# Patient Record
Sex: Male | Born: 1950 | Race: Black or African American | Hispanic: No | Marital: Married | Smoking: Never smoker
Health system: Southern US, Community
[De-identification: ages and names within clinical notes are randomized; demographics above are authoritative.]

## PROBLEM LIST (undated history)

## (undated) DIAGNOSIS — E119 Type 2 diabetes mellitus without complications: Secondary | ICD-10-CM

## (undated) DIAGNOSIS — N289 Disorder of kidney and ureter, unspecified: Secondary | ICD-10-CM

## (undated) DIAGNOSIS — I10 Essential (primary) hypertension: Secondary | ICD-10-CM

## (undated) DIAGNOSIS — I214 Non-ST elevation (NSTEMI) myocardial infarction: Secondary | ICD-10-CM

## (undated) HISTORY — DX: Non-ST elevation (NSTEMI) myocardial infarction: I21.4

## (undated) HISTORY — PX: CORONARY ANGIOPLASTY: SHX604

## (undated) HISTORY — PX: CARDIAC CATHETERIZATION: SHX172

## (undated) HISTORY — PX: CYSTOSCOPY: SUR368

---

## 1999-07-12 ENCOUNTER — Emergency Department (HOSPITAL_COMMUNITY): Admission: EM | Admit: 1999-07-12 | Discharge: 1999-07-12 | Payer: Self-pay | Admitting: Emergency Medicine

## 1999-07-13 ENCOUNTER — Encounter: Payer: Self-pay | Admitting: Emergency Medicine

## 2000-06-16 ENCOUNTER — Emergency Department (HOSPITAL_COMMUNITY): Admission: EM | Admit: 2000-06-16 | Discharge: 2000-06-16 | Payer: Self-pay | Admitting: Emergency Medicine

## 2000-06-16 ENCOUNTER — Encounter: Payer: Self-pay | Admitting: Emergency Medicine

## 2006-10-10 ENCOUNTER — Encounter: Admission: RE | Admit: 2006-10-10 | Discharge: 2006-10-10 | Payer: Self-pay | Admitting: Urology

## 2007-12-14 ENCOUNTER — Ambulatory Visit (HOSPITAL_COMMUNITY): Admission: RE | Admit: 2007-12-14 | Discharge: 2007-12-14 | Payer: Self-pay | Admitting: *Deleted

## 2007-12-14 ENCOUNTER — Encounter (INDEPENDENT_AMBULATORY_CARE_PROVIDER_SITE_OTHER): Payer: Self-pay | Admitting: *Deleted

## 2010-10-30 NOTE — Op Note (Signed)
Colin Weaver, Colin Weaver                 ACCOUNT NO.:  [id number]   MEDICAL RECORD NO.:  [id number]          PATIENT TYPE:  AMB   LOCATION:  ENDO                         FACILITY:  [facility]   PHYSICIAN:  Georgiana Spinner, M.D.    DATE OF BIRTH:  12/04/1950   DATE OF PROCEDURE:  DATE OF DISCHARGE:                               OPERATIVE REPORT   PROCEDURE:  Colonoscopy.   INDICATIONS:  Colon cancer screening.   ANESTHESIA:  Fentanyl 20 mcg, Versed 1 mg.   DESCRIPTION OF PROCEDURE:  With the patient mildly sedated in the left  lateral decubitus position, a rectal exam was performed which was  unremarkable.  Subsequently, the Pentax videoscopic colonoscope was  inserted into the rectum and passed under direct vision to the cecum,  identified by ileocecal valve and appendiceal orifice, both which were  photographed.  From this point, the colonoscope was slowly withdrawn  taking circumferential views of the colonic mucosa, stopping at  approximately 50 cm from the anal verge at which point there appeared to  be a polypoid area which may have been abnormal mucosa.  It appeared  normal, but polypoid, so I elected to just biopsy it.  We then withdrew  the colonoscope taking circumferential views of the remaining colonic  mucosa.  As we withdrew to the rectum, stopping at 25 cm from anal verge  at which point there was a polyp seen, photographed and removed using  snare cautery technique on a setting of 20/150 blended current around  the base of the stalk.  There was good hemostasis.  Subsequently, the  rectum appeared normal on direct and showed hemorrhoids on retroflexed  view.  The endoscope was straightened and withdrawn.  The patient's  vital signs and pulse oximeter remained stable.  The patient tolerated  the procedure well without apparent complication.   FINDINGS:  Polyp at 25 cm.  Questionable polyp of 50 cm, which may be  normal.  Internal hemorrhoids and some mild diverticulosis.   PLAN:  Await biopsy reports.  The patient will call me for results and  follow-up with me as needed as an outpatient.           ______________________________  Georgiana Spinner, M.D.     GMO/MEDQ  D:  12/14/2007  T:  12/14/2007  Job:  [id number]

## 2010-10-30 NOTE — Op Note (Signed)
Colin Weaver, Colin Weaver                 ACCOUNT NO.:  [id number]   MEDICAL RECORD NO.:  [id number]          PATIENT TYPE:  AMB   LOCATION:  ENDO                         FACILITY:  [facility]   PHYSICIAN:  Georgiana Spinner, M.D.    DATE OF BIRTH:  1950-10-26   DATE OF PROCEDURE:  12/14/2007  DATE OF DISCHARGE:                               OPERATIVE REPORT   PROCEDURE:  Upper endoscopy.   INDICATIONS:  GERD.   ANESTHESIA:  1. Fentanyl 60 mcg.  2. Versed 6 mg.   PROCEDURE:  With the patient mildly sedated in the left lateral  decubitus position, the Pentax videoscopic endoscope was inserted in the  mouth and passed under direct vision through the esophagus, which  appeared normal, except there were questionable islands of Barrett's  esophagus.  It did not fully distend, so I could tell if this was just a  normal variant, so I elected to biopsy the areas.  We entered into the  stomach.  Fundus and body appeared normal.  Antrum, however, showed some  erythematous changes which were photographed and biopsied.  Duodenal  bulb and second portion of the duodenum appeared normal.  From this  point, the endoscope was slowly withdrawn, taking circumferential views  of the duodenal mucosa until the endoscope had been pulled back into the  stomach and placed in retroflexion to the stomach from below.  The  endoscope was straightened and withdrawn, taking circumferential views  of the remaining gastric and esophageal mucosa.  The patient's vital  signs and pulse oximeter remained stable.  The patient tolerated the  procedure well, without apparent complications.   FINDINGS:  1. Question of Barrett's esophagus, biopsied.  Await biopsy report.  2. Erythema of antrum, biopsied.  Await biopsy report.   The patient will call me for results and follow up with me as an  outpatient.  Proceed to colonoscopy as planned           ______________________________  Georgiana Spinner, M.D.     GMO/MEDQ  D:   12/14/2007  T:  12/14/2007  Job:  [id number]

## 2013-08-22 ENCOUNTER — Emergency Department (HOSPITAL_COMMUNITY)
Admission: EM | Admit: 2013-08-22 | Discharge: 2013-08-22 | Disposition: A | Discharge status: Home / Self Care | Payer: 59 | Attending: Emergency Medicine | Admitting: Emergency Medicine

## 2013-08-22 ENCOUNTER — Emergency Department (HOSPITAL_COMMUNITY): Payer: 59

## 2013-08-22 ENCOUNTER — Encounter (HOSPITAL_COMMUNITY): Payer: Self-pay | Admitting: Emergency Medicine

## 2013-08-22 DIAGNOSIS — N200 Calculus of kidney: Secondary | ICD-10-CM | POA: Insufficient documentation

## 2013-08-22 DIAGNOSIS — I10 Essential (primary) hypertension: Secondary | ICD-10-CM | POA: Insufficient documentation

## 2013-08-22 DIAGNOSIS — E119 Type 2 diabetes mellitus without complications: Secondary | ICD-10-CM | POA: Insufficient documentation

## 2013-08-22 DIAGNOSIS — R52 Pain, unspecified: Secondary | ICD-10-CM | POA: Insufficient documentation

## 2013-08-22 HISTORY — DX: Disorder of kidney and ureter, unspecified: N28.9

## 2013-08-22 HISTORY — DX: Type 2 diabetes mellitus without complications: E11.9

## 2013-08-22 HISTORY — DX: Essential (primary) hypertension: I10

## 2013-08-22 LAB — COMPREHENSIVE METABOLIC PANEL
ALT: 15 U/L (ref 0–53)
AST: 15 U/L (ref 0–37)
Albumin: 3.5 g/dL (ref 3.5–5.2)
Alkaline Phosphatase: 52 U/L (ref 39–117)
BUN: 13 mg/dL (ref 6–23)
CO2: 25 mEq/L (ref 19–32)
Calcium: 9.2 mg/dL (ref 8.4–10.5)
Chloride: 103 mEq/L (ref 96–112)
Creatinine, Ser: 1.28 mg/dL (ref 0.50–1.35)
GFR calc Af Amer: 68 mL/min — ABNORMAL LOW (ref >90)
GFR calc non Af Amer: 59 mL/min — ABNORMAL LOW (ref >90)
Glucose, Bld: 145 mg/dL — ABNORMAL HIGH (ref 70–99)
Potassium: 4.2 mEq/L (ref 3.7–5.3)
Sodium: 142 mEq/L (ref 137–147)
Total Bilirubin: 0.2 mg/dL — ABNORMAL LOW (ref 0.3–1.2)
Total Protein: 7.3 g/dL (ref 6.0–8.3)

## 2013-08-22 LAB — CBC WITH DIFFERENTIAL/PLATELET
Basophils Absolute: 0 10*3/uL (ref 0.0–0.1)
Basophils Relative: 0 % (ref 0–1)
Eosinophils Absolute: 0.1 10*3/uL (ref 0.0–0.7)
Eosinophils Relative: 1 % (ref 0–5)
HCT: 42.2 % (ref 39.0–52.0)
Hemoglobin: 14.1 g/dL (ref 13.0–17.0)
Lymphocytes Relative: 29 % (ref 12–46)
Lymphs Abs: 2.9 10*3/uL (ref 0.7–4.0)
MCH: 29.3 pg (ref 26.0–34.0)
MCHC: 33.4 g/dL (ref 30.0–36.0)
MCV: 87.7 fL (ref 78.0–100.0)
Monocytes Absolute: 1.2 10*3/uL — ABNORMAL HIGH (ref 0.1–1.0)
Monocytes Relative: 12 % (ref 3–12)
Neutro Abs: 6 10*3/uL (ref 1.7–7.7)
Neutrophils Relative %: 59 % (ref 43–77)
Platelets: 264 10*3/uL (ref 150–400)
RBC: 4.81 MIL/uL (ref 4.22–5.81)
RDW: 13.7 % (ref 11.5–15.5)
WBC: 10.2 10*3/uL (ref 4.0–10.5)

## 2013-08-22 LAB — URINALYSIS, ROUTINE W REFLEX MICROSCOPIC
Bilirubin Urine: NEGATIVE
Glucose, UA: NEGATIVE mg/dL
Ketones, ur: NEGATIVE mg/dL
Leukocytes, UA: NEGATIVE
Nitrite: NEGATIVE
Protein, ur: NEGATIVE mg/dL
Specific Gravity, Urine: 1.03 (ref 1.005–1.030)
Urobilinogen, UA: 0.2 mg/dL (ref 0.0–1.0)
pH: 6 (ref 5.0–8.0)

## 2013-08-22 LAB — URINE MICROSCOPIC-ADD ON

## 2013-08-22 MED ORDER — ONDANSETRON 4 MG PO TBDP
8.0000 mg | ORAL_TABLET | Freq: Once | ORAL | Status: AC
  Administered 2013-08-22: 8 mg via ORAL
  Filled 2013-08-22: qty 2

## 2013-08-22 MED ORDER — TAMSULOSIN HCL 0.4 MG PO CAPS: 0.4000 mg | ORAL_CAPSULE | Freq: Every day | ORAL | Status: DC

## 2013-08-22 MED ORDER — OXYCODONE-ACETAMINOPHEN 5-325 MG PO TABS
2.0000 | ORAL_TABLET | Freq: Once | ORAL | Status: AC
  Administered 2013-08-22: 2 via ORAL
  Filled 2013-08-22: qty 2

## 2013-08-22 MED ORDER — KETOROLAC TROMETHAMINE 30 MG/ML IJ SOLN
60.0000 mg | Freq: Once | INTRAMUSCULAR | Status: AC
  Administered 2013-08-22: 60 mg via INTRAMUSCULAR
  Filled 2013-08-22: qty 2

## 2013-08-22 MED ORDER — OXYCODONE-ACETAMINOPHEN 5-325 MG PO TABS: 2.0000 | ORAL_TABLET | ORAL | Status: DC | PRN

## 2013-08-22 MED ORDER — ONDANSETRON 8 MG PO TBDP: 8.0000 mg | ORAL_TABLET | Freq: Three times a day (TID) | ORAL | Status: DC | PRN

## 2013-08-22 NOTE — ED Notes (Signed)
Patient has hx of kidney stones. Patient reports that this pain is just like his pain with his previous kidney stone. Patient does have nausea at this time but no emesis. No dysuria, but does report hematuria.

## 2013-08-22 NOTE — ED Notes (Signed)
The pt is c/0 lt flank pain since 1700 today.  He has had bloody urine for the past 3 days.  Hx of kidney stones

## 2013-08-22 NOTE — ED Notes (Signed)
Patient is being discharged with wife to home.

## 2013-08-22 NOTE — ED Provider Notes (Signed)
CSN: [id number]     Arrival date & time 08/22/13  0134 History   First MD Initiated Contact with Patient 08/22/13 [phone]     Chief Complaint  Patient presents with  . Flank Pain     (Consider location/radiation/quality/duration/timing/severity/associated sxs/prior Treatment) HPI 63 year old male presents to emergency room with complaint of left flank.  Pain started around 5 PM today.  Pain starts in his left back and radiates around into his groin.  Patient has history of kidney stones on the right, last 5 years ago.  No prior interventions, for stones, has been able to pass them  on his own in the past week.  He has had nausea without vomiting.  No fever no chills. Past Medical History  Diagnosis Date  . Hypertension   . Diabetes mellitus without complication   . Renal disorder    History reviewed. No pertinent past surgical history. No family history on file. History  Substance Use Topics  . Smoking status: Never Smoker   . Smokeless tobacco: Not on file  . Alcohol Use: No    Review of Systems   See History of Present Illness; otherwise all other systems are reviewed and negative  Allergies  Prinivil  Home Medications   Current Outpatient Rx  Name  Route  Sig  Dispense  Refill  . ondansetron (ZOFRAN ODT) 8 MG disintegrating tablet   Oral   Take 1 tablet (8 mg total) by mouth every 8 (eight) hours as needed for nausea or vomiting.   20 tablet   0   . oxyCODONE-acetaminophen (PERCOCET/ROXICET) 5-325 MG per tablet   Oral   Take 2 tablets by mouth every 4 (four) hours as needed for severe pain.   20 tablet   0   . tamsulosin (FLOMAX) 0.4 MG CAPS capsule   Oral   Take 1 capsule (0.4 mg total) by mouth daily.   10 capsule   0    BP 131/76  Pulse 55  Temp(Src) 97.8 F (36.6 C) (Oral)  Resp 15  Ht 5\' 10"  (1.778 m)  Wt 267 lb 6 oz (121.281 kg)  BMI 38.36 kg/m2  SpO2 97% Physical Exam  Nursing note and vitals reviewed. Constitutional: He is oriented to person,  place, and time. He appears well-developed and well-nourished. He appears distressed.  HENT:  Head: Normocephalic and atraumatic.  Right Ear: External ear normal.  Left Ear: External ear normal.  Nose: Nose normal.  Mouth/Throat: Oropharynx is clear and moist.  Eyes: Conjunctivae and EOM are normal. Pupils are equal, round, and reactive to light.  Neck: Normal range of motion. Neck supple. No JVD present. No tracheal deviation present. No thyromegaly present.  Cardiovascular: Normal rate, regular rhythm, normal heart sounds and intact distal pulses.  Exam reveals no gallop and no friction rub.   No murmur heard. Pulmonary/Chest: Effort normal and breath sounds normal. No stridor. No respiratory distress. He has no wheezes. He has no rales. He exhibits no tenderness.  Abdominal: Soft. Bowel sounds are normal. He exhibits no distension and no mass. There is no tenderness. There is no rebound and no guarding.  Musculoskeletal: Normal range of motion. He exhibits no edema and no tenderness.  Lymphadenopathy:    He has no cervical adenopathy.  Neurological: He is alert and oriented to person, place, and time. He exhibits normal muscle tone. Coordination normal.  Skin: Skin is warm and dry. No rash noted. No erythema. No pallor.  Psychiatric: He has a normal mood and affect.  His behavior is normal. Judgment and thought content normal.    ED Course  Procedures (including critical care time) Labs Review Labs Reviewed  URINALYSIS, ROUTINE W REFLEX MICROSCOPIC - Abnormal; Notable for the following:    APPearance CLOUDY (*)    Hgb urine dipstick LARGE (*)    All other components within normal limits  CBC WITH DIFFERENTIAL - Abnormal; Notable for the following:    Monocytes Absolute 1.2 (*)    All other components within normal limits  COMPREHENSIVE METABOLIC PANEL - Abnormal; Notable for the following:    Glucose, Bld 145 (*)    Total Bilirubin 0.2 (*)    GFR calc non Af Amer 59 (*)    GFR  calc Af Amer 68 (*)    All other components within normal limits  URINE MICROSCOPIC-ADD ON - Abnormal; Notable for the following:    Casts HYALINE CASTS (*)    All other components within normal limits   Imaging Review Ct Abdomen Pelvis Wo Contrast  08/22/2013   CLINICAL DATA:  Left flank pain  EXAM: CT ABDOMEN AND PELVIS WITHOUT CONTRAST  TECHNIQUE: Multidetector CT imaging of the abdomen and pelvis was performed following the standard protocol without intravenous contrast.  COMPARISON:  10/10/2006  FINDINGS: BODY WALL: Fatty right inguinal hernia.  LOWER CHEST: Borderline cardiomegaly.  ABDOMEN/PELVIS:  Liver: No focal abnormality.  Biliary: Layering high density, suspect cholelithiasis.  Pancreas: Unremarkable.  Spleen: Unremarkable.  Adrenals: Unremarkable.  Kidneys and ureters: Mild left hydroureteronephrosis with asymmetric left perinephric edema, secondary to a 5 mm stone in the distal third of the left ureter. There is a punctate stone in the upper pole right kidney which is nonobstructive. There are bilateral water density renal masses, presumably cysts. The largest on the left is posterior interpolar, 4 cm in diameter. The largest on the right is 15 mm in the interpolar region.  Bladder: Unremarkable.  Reproductive: Unremarkable.  Bowel: Extensive colonic diverticulosis distally. No bowel obstruction. Normal appendix.  Retroperitoneum: No mass or adenopathy.  Peritoneum: No free fluid or gas.  Vascular: No acute abnormality.  OSSEOUS: No acute abnormalities.  IMPRESSION: 1. Obstructing 5 mm stone in the distal left ureter. 2. Small, nonobstructive right nephrolithiasis. 3. Colonic diverticulosis. 4. Bilateral renal cysts. 5. Probable cholelithiasis.   Electronically Signed   By: Tiburcio Pea M.D.   On: 08/22/2013 06:34     EKG Interpretation None      MDM   Final diagnoses:  Left nephrolithiasis    63 year old male with left flank.  Pain that seems typical for renal colic.  Plan for  CT without contrast of abdomen and pelvis to rule out other life-threatening causes.  Will provide patient with pain medication.    Olivia Mackie, MD 08/22/13 [phone]

## 2013-08-22 NOTE — Discharge Instructions (Signed)
You have a 5 mm kidney stone on the left side midway between your kidney and your bladder.  Take medications as prescribed.  You should be able to pass the stone on your own.  Followup with your urologist this week for recheck.  Return to the emergency room for worsening condition or new concerning symptoms.

## 2013-08-26 ENCOUNTER — Ambulatory Visit: Payer: Self-pay | Admitting: Anesthesiology

## 2013-08-26 LAB — BASIC METABOLIC PANEL
Anion Gap: 4 — ABNORMAL LOW (ref 7–16)
BUN: 8 mg/dL (ref 7–18)
Calcium, Total: 8.9 mg/dL (ref 8.5–10.1)
Chloride: 101 mmol/L (ref 98–107)
Co2: 28 mmol/L (ref 21–32)
Creatinine: 1.92 mg/dL — ABNORMAL HIGH (ref 0.60–1.30)
EGFR (African American): 43 — ABNORMAL LOW
EGFR (Non-African Amer.): 37 — ABNORMAL LOW
Glucose: 141 mg/dL — ABNORMAL HIGH (ref 65–99)
Osmolality: 267 (ref 275–301)
Potassium: 4.8 mmol/L (ref 3.5–5.1)
Sodium: 133 mmol/L — ABNORMAL LOW (ref 136–145)

## 2013-08-30 ENCOUNTER — Ambulatory Visit: Payer: Self-pay | Admitting: Urology

## 2014-10-08 NOTE — Op Note (Signed)
PATIENT NAME:  Sheryle HailGOLSON, Kayd MR#:  [id number] DATE OF BIRTH:  Oct 17, 1950  DATE OF PROCEDURE:  08/30/2013  PREOPERATIVE DIAGNOSIS: Left ureterolithiasis.   POSTOPERATIVE DIAGNOSIS: Passed his left ureteral calculus.   PROCEDURES: 1.  Left ureteroscopy.  2.  Fluoroscopy.   SURGEON: Anola GurneyMichael Bellamy Rubey, M.D.   ANESTHETIST: Dr. Dimple Caseyice.   ANESTHETIC METHOD: General per Dr. Dimple Caseyice and local per Dr. Evelene CroonWolff.   INDICATIONS: See his dictated history and physical. After informed consent, the patient requests the above procedure.   OPERATIVE SUMMARY: After adequate general anesthesia had been obtained, the patient was placed into dorsal lithotomy position and the perineum was prepped and draped in the usual fashion. The 21-French cystoscope was coupled with the camera and then visually advanced into the bladder. The patient had lateral lobe prostatic hypertrophy. No bladder mucosal lesions were identified. Both ureteral orifices were identified and had clear efflux. At this point, fluoroscopy was performed. Definite ureteral stone could not be identified. A 0.035 Glidewire was then advanced up the left ureter. The intramural ureter was dilated up to 5 mm with a balloon dilating catheter. The balloon dilating catheter was then removed, taking care to leave the guidewire in position. The mini rigid ureteroscope was coupled with the camera and then advanced into the left orifice. The scope was advanced to mid ureter and stone could not be identified. Due to angulation, the scope could not be advanced beyond this point. The rigid ureteroscope was then removed. Ureteral access sheath was then placed. The flexible ureteroscope was then coupled with the camera and advanced into the access sheath. Ureteroscope was advanced up the ureter beyond the prior mid ureter and into the kidney. All calyces were inspected and the stone was not located. The scope was then backed out of the ureter along with access sheath and ureter  reinspected. A stone was not located. At this point, the scope and sheath were removed; 10 mL of viscous Xylocaine was instilled within the urethra and the bladder. The procedure was then terminated and the patient was transferred to the recovery room in stable condition.      ____________________________ Suszanne ConnersMichael R. Evelene CroonWolff, MD mrw:dmm D: 08/30/2013 13:31:00 ET T: 08/30/2013 21:58:56 ET JOB#: [id number]  cc: Suszanne ConnersMichael R. Evelene CroonWolff, MD, <Dictator> Orson ApeMICHAEL R Tora Prunty MD ELECTRONICALLY SIGNED 08/31/2013 9:31

## 2014-10-08 NOTE — H&P (Signed)
PATIENT NAME:  Colin Weaver, Colin Weaver MR#:  [id number] DATE OF BIRTH:  [birth year]  DATE OF ADMISSION:  08/30/2013  CHIEF COMPLAINT: Left flank pain and hematuria.   HISTORY OF PRESENT ILLNESS: Mr. Colin Weaver is a 64 year old African American male who presented to the Emergency Room on March the 8th with severe left flank pain. CT scan was performed indicating the presence of a 5 mm obstructing distal left ureteral stone. He has failed to pass the stone and comes in now for left ureteroscopic ureterolithotomy with holmium laser lithotripsy.   ALLERGIES: No known drug allergies.   CURRENT MEDICATIONS: Include aspirin, Cialis, Cozaar, metformin, Norvasc, Zofran, Percocet, pravastatin, Rapaflo, and Zoloft.   PAST SURGICAL HISTORY: Testicular cyst removal.   PAST AND CURRENT MEDICAL CONDITIONS:  1.  Diabetes.  2.  Hypertension.  3.  Hypercholesterolemia.  4.  Erectile dysfunction.   SOCIAL HISTORY: The patient denied tobacco or alcohol use.   FAMILY HISTORY: Remarkable for parents with diabetes.   REVIEW OF SYSTEMS: The patient denies chest pain, shortness of breath or stroke. He also denied history of coronary artery disease.   PHYSICAL EXAMINATION: GENERAL: Well-nourished African American male in no acute distress.  HEENT: Sclerae were clear.  NECK: Supple. No palpable cervical adenopathy.  LUNGS: Clear to auscultation.  HEART: Regular rhythm and rate without audible murmurs.  ABDOMEN: Soft, nontender abdomen.  GENITOURINARY: Testes were smooth and nontender, 18 mL in size each.  RECTAL: A 25 gram smooth nontender prostate.  NEUROMUSCULAR: Alert and oriented x3.   IMPRESSION: Left ureterolithiasis with hydronephrosis.   PLAN: Left ureteroscopic ureterolithotomy with holmium laser lithotripsy. ____________________________ Suszanne ConnersMichael R. Evelene CroonWolff, MD mrw:sb D: 08/26/2013 16:53:38 ET T: 08/26/2013 17:16:26 ET JOB#: [id number]  cc: Suszanne ConnersMichael R. Evelene CroonWolff, MD, <Dictator> Orson ApeMICHAEL R Cayden Granholm MD ELECTRONICALLY  SIGNED 08/30/2013 9:53

## 2015-06-02 ENCOUNTER — Other Ambulatory Visit: Payer: Self-pay | Admitting: Gastroenterology

## 2015-09-09 ENCOUNTER — Observation Stay: Payer: 59

## 2015-09-09 ENCOUNTER — Encounter: Payer: Self-pay | Admitting: Emergency Medicine

## 2015-09-09 ENCOUNTER — Observation Stay
Admission: EM | Admit: 2015-09-09 | Discharge: 2015-09-10 | Disposition: A | Discharge status: Home / Self Care | Payer: 59 | Attending: Internal Medicine | Admitting: Internal Medicine

## 2015-09-09 DIAGNOSIS — Z7982 Long term (current) use of aspirin: Secondary | ICD-10-CM | POA: Insufficient documentation

## 2015-09-09 DIAGNOSIS — I519 Heart disease, unspecified: Secondary | ICD-10-CM | POA: Insufficient documentation

## 2015-09-09 DIAGNOSIS — R55 Syncope and collapse: Principal | ICD-10-CM | POA: Diagnosis present

## 2015-09-09 DIAGNOSIS — R001 Bradycardia, unspecified: Secondary | ICD-10-CM | POA: Diagnosis present

## 2015-09-09 DIAGNOSIS — E785 Hyperlipidemia, unspecified: Secondary | ICD-10-CM | POA: Insufficient documentation

## 2015-09-09 DIAGNOSIS — I10 Essential (primary) hypertension: Secondary | ICD-10-CM | POA: Insufficient documentation

## 2015-09-09 DIAGNOSIS — Z79899 Other long term (current) drug therapy: Secondary | ICD-10-CM | POA: Insufficient documentation

## 2015-09-09 DIAGNOSIS — Z7984 Long term (current) use of oral hypoglycemic drugs: Secondary | ICD-10-CM | POA: Diagnosis not present

## 2015-09-09 DIAGNOSIS — R05 Cough: Secondary | ICD-10-CM | POA: Insufficient documentation

## 2015-09-09 DIAGNOSIS — I517 Cardiomegaly: Secondary | ICD-10-CM | POA: Insufficient documentation

## 2015-09-09 DIAGNOSIS — R29898 Other symptoms and signs involving the musculoskeletal system: Secondary | ICD-10-CM | POA: Diagnosis not present

## 2015-09-09 DIAGNOSIS — E119 Type 2 diabetes mellitus without complications: Secondary | ICD-10-CM | POA: Diagnosis not present

## 2015-09-09 DIAGNOSIS — F329 Major depressive disorder, single episode, unspecified: Secondary | ICD-10-CM | POA: Insufficient documentation

## 2015-09-09 DIAGNOSIS — Z888 Allergy status to other drugs, medicaments and biological substances status: Secondary | ICD-10-CM | POA: Diagnosis not present

## 2015-09-09 DIAGNOSIS — I959 Hypotension, unspecified: Secondary | ICD-10-CM | POA: Insufficient documentation

## 2015-09-09 LAB — URINALYSIS COMPLETE WITH MICROSCOPIC (ARMC ONLY)
Bacteria, UA: NONE SEEN
Bilirubin Urine: NEGATIVE
Glucose, UA: 150 mg/dL — AB
Hgb urine dipstick: NEGATIVE
Ketones, ur: NEGATIVE mg/dL
Leukocytes, UA: NEGATIVE
Nitrite: NEGATIVE
Protein, ur: 30 mg/dL — AB
Specific Gravity, Urine: 1.027 (ref 1.005–1.030)
pH: 5 (ref 5.0–8.0)

## 2015-09-09 LAB — CBC
HCT: 41.9 % (ref 40.0–52.0)
Hemoglobin: 13.8 g/dL (ref 13.0–18.0)
MCH: 28.6 pg (ref 26.0–34.0)
MCHC: 32.9 g/dL (ref 32.0–36.0)
MCV: 86.9 fL (ref 80.0–100.0)
Platelets: 220 10*3/uL (ref 150–440)
RBC: 4.82 MIL/uL (ref 4.40–5.90)
RDW: 14.1 % (ref 11.5–14.5)
WBC: 7.8 10*3/uL (ref 3.8–10.6)

## 2015-09-09 LAB — BASIC METABOLIC PANEL
Anion gap: 6 (ref 5–15)
BUN: 15 mg/dL (ref 6–20)
CO2: 25 mmol/L (ref 22–32)
Calcium: 8.3 mg/dL — ABNORMAL LOW (ref 8.9–10.3)
Chloride: 103 mmol/L (ref 101–111)
Creatinine, Ser: 1.4 mg/dL — ABNORMAL HIGH (ref 0.61–1.24)
GFR calc Af Amer: 60 mL/min — ABNORMAL LOW (ref >60)
GFR calc non Af Amer: 52 mL/min — ABNORMAL LOW (ref >60)
Glucose, Bld: 281 mg/dL — ABNORMAL HIGH (ref 65–99)
Potassium: 4.2 mmol/L (ref 3.5–5.1)
Sodium: 134 mmol/L — ABNORMAL LOW (ref 135–145)

## 2015-09-09 LAB — GLUCOSE, CAPILLARY
Glucose-Capillary: 208 mg/dL — ABNORMAL HIGH (ref 65–99)
Glucose-Capillary: 216 mg/dL — ABNORMAL HIGH (ref 65–99)

## 2015-09-09 LAB — TROPONIN I: Troponin I: <0.03 ng/mL (ref <0.031)

## 2015-09-09 LAB — TSH: TSH: 2.085 u[IU]/mL (ref 0.350–4.500)

## 2015-09-09 MED ORDER — ONDANSETRON HCL 4 MG PO TABS: 4.0000 mg | ORAL_TABLET | Freq: Four times a day (QID) | ORAL | Status: DC | PRN

## 2015-09-09 MED ORDER — METFORMIN HCL 500 MG PO TABS
1000.0000 mg | ORAL_TABLET | Freq: Every day | ORAL | Status: DC
  Administered 2015-09-09: 1000 mg via ORAL
  Filled 2015-09-09: qty 2

## 2015-09-09 MED ORDER — SODIUM CHLORIDE 0.9% FLUSH: 3.0000 mL | INTRAVENOUS | Status: DC | PRN

## 2015-09-09 MED ORDER — SERTRALINE HCL 100 MG PO TABS
100.0000 mg | ORAL_TABLET | Freq: Every day | ORAL | Status: DC
  Administered 2015-09-09 – 2015-09-10 (×2): 100 mg via ORAL
  Filled 2015-09-09 (×2): qty 1

## 2015-09-09 MED ORDER — ACETAMINOPHEN 650 MG RE SUPP: 650.0000 mg | Freq: Four times a day (QID) | RECTAL | Status: DC | PRN

## 2015-09-09 MED ORDER — SODIUM CHLORIDE 0.9% FLUSH
3.0000 mL | Freq: Two times a day (BID) | INTRAVENOUS | Status: DC
  Administered 2015-09-09: 3 mL via INTRAVENOUS

## 2015-09-09 MED ORDER — SODIUM CHLORIDE 0.9 % IV SOLN: 250.0000 mL | INTRAVENOUS | Status: DC | PRN

## 2015-09-09 MED ORDER — TESTOSTERONE 20.25 MG/ACT (1.62%) TD GEL: 1.0000 "application " | Freq: Every day | TRANSDERMAL | Status: DC

## 2015-09-09 MED ORDER — ENOXAPARIN SODIUM 40 MG/0.4ML ~~LOC~~ SOLN
40.0000 mg | Freq: Two times a day (BID) | SUBCUTANEOUS | Status: DC
  Administered 2015-09-09 – 2015-09-10 (×2): 40 mg via SUBCUTANEOUS
  Filled 2015-09-09 (×2): qty 0.4

## 2015-09-09 MED ORDER — LOSARTAN POTASSIUM 50 MG PO TABS
50.0000 mg | ORAL_TABLET | Freq: Every day | ORAL | Status: DC
  Filled 2015-09-09: qty 1

## 2015-09-09 MED ORDER — ONDANSETRON HCL 4 MG/2ML IJ SOLN
4.0000 mg | Freq: Four times a day (QID) | INTRAMUSCULAR | Status: DC | PRN
Start: 2015-09-09 — End: 2015-09-10

## 2015-09-09 MED ORDER — ACETAMINOPHEN 325 MG PO TABS: 650.0000 mg | ORAL_TABLET | Freq: Four times a day (QID) | ORAL | Status: DC | PRN

## 2015-09-09 MED ORDER — ASPIRIN EC 81 MG PO TBEC
81.0000 mg | DELAYED_RELEASE_TABLET | Freq: Every day | ORAL | Status: DC
  Administered 2015-09-09 – 2015-09-10 (×2): 81 mg via ORAL
  Filled 2015-09-09 (×2): qty 1

## 2015-09-09 MED ORDER — TAMSULOSIN HCL 0.4 MG PO CAPS: 0.4000 mg | ORAL_CAPSULE | Freq: Every day | ORAL | Status: DC

## 2015-09-09 MED ORDER — AMLODIPINE BESYLATE 5 MG PO TABS
5.0000 mg | ORAL_TABLET | Freq: Every day | ORAL | Status: DC
  Filled 2015-09-09: qty 1

## 2015-09-09 NOTE — H&P (Signed)
[facility] [place] at [facility]   PATIENT NAME: Colin Weaver    MR#:  [id number]  DATE OF BIRTH:  January 28, 1951  DATE OF ADMISSION:  09/09/2015  PRIMARY CARE PHYSICIAN: Michiel Sites, MD   REQUESTING/REFERRING PHYSICIAN: Minna Antis MD  CHIEF COMPLAINT:   Chief Complaint  Patient presents with  . Near Syncope    HISTORY OF PRESENT ILLNESS: Colin Weaver  is a 65 y.o. male with a known history of  hypertension, diabetes type 2 who presents with a near syncope while at church. Patient felt like he was going go down on his feet. His church members came and helped him to the floor. They gave him juice since he is a diabetic. EMS was called. According to the witnesses he did pass out patient states that he is not sure if he passed out. When EMS arrived his blood sugar was 172. Patient reported that he's had a recent cough and cold congestion. And has to go over-the-counter medications for that. He did not have any chest pain or shortness of breath or palpitations has not had syncope he has had history of having hypoglycemia in the past. Feels well  PAST MEDICAL HISTORY:   Past Medical History  Diagnosis Date  . Hypertension   . Diabetes mellitus without complication (HCC)   . Renal disorder     PAST SURGICAL HISTORY: History reviewed. No pertinent past surgical history.  SOCIAL HISTORY:  Social History  Substance Use Topics  . Smoking status: Never Smoker   . Smokeless tobacco: Not on file  . Alcohol Use: No    FAMILY HISTORY: History reviewed. No pertinent family history.  DRUG ALLERGIES:  Allergies  Allergen Reactions  . Prinivil [Lisinopril] Cough    REVIEW OF SYSTEMS:   CONSTITUTIONAL: No fever, fatigue or weakness.  EYES: No blurred or double vision.  EARS, NOSE, AND THROAT: No tinnitus or ear pain. Positive congestion in the nose RESPIRATORY: No cough, shortness of breath, wheezing or hemoptysis.  CARDIOVASCULAR: No chest pain,  orthopnea, edema.  GASTROINTESTINAL: No nausea, vomiting, diarrhea or abdominal pain.  GENITOURINARY: No dysuria, hematuria.  ENDOCRINE: No polyuria, nocturia,  HEMATOLOGY: No anemia, easy bruising or bleeding SKIN: No rash or lesion. MUSCULOSKELETAL: No joint pain or arthritis.   NEUROLOGIC: No tingling, numbness, weakness.  PSYCHIATRY: No anxiety or depression.   MEDICATIONS AT HOME:  Prior to Admission medications   Medication Sig Start Date End Date Taking? Authorizing Provider  amLODipine (NORVASC) 5 MG tablet Take 5 mg by mouth daily.   Yes Historical Provider, MD  ANDROGEL PUMP 20.25 MG/ACT (1.62%) GEL Apply 1 application topically daily. Apply to each shoulders as directed. 09/08/15  Yes Historical Provider, MD  aspirin EC 81 MG tablet Take 81 mg by mouth daily.   Yes Historical Provider, MD  losartan (COZAAR) 50 MG tablet Take 50 mg by mouth daily.   Yes Historical Provider, MD  metFORMIN (GLUCOPHAGE) 500 MG tablet Take 1,000 mg by mouth daily.  08/25/15  Yes Historical Provider, MD  RAPAFLO 8 MG CAPS capsule Take 8 mg by mouth daily. 08/25/15  Yes Historical Provider, MD  sertraline (ZOLOFT) 100 MG tablet Take 100 mg by mouth daily. 09/08/15  Yes Historical Provider, MD  ondansetron (ZOFRAN ODT) 8 MG disintegrating tablet Take 1 tablet (8 mg total) by mouth every 8 (eight) hours as needed for nausea or vomiting. 08/22/13   Marisa Severin, MD  oxyCODONE-acetaminophen (PERCOCET/ROXICET) 5-325 MG per tablet Take 2 tablets by  mouth every 4 (four) hours as needed for severe pain. 08/22/13   Marisa Severinlga Otter, MD      PHYSICAL EXAMINATION:   VITAL SIGNS: Blood pressure 127/109, pulse 81, temperature 98.5 F (36.9 C), temperature source Oral, resp. rate 10, height 5\' 9"  (1.753 m), weight 125.556 kg (276 lb 12.8 oz), SpO2 98 %.  GENERAL:  65 y.o.-year-old patient lying in the bed with no acute distress.  EYES: Pupils equal, round, reactive to light and accommodation. No scleral icterus. Extraocular  muscles intact.  HEENT: Head atraumatic, normocephalic. Oropharynx and nasopharynx clear.  NECK:  Supple, no jugular venous distention. No thyroid enlargement, no tenderness.  LUNGS: Normal breath sounds bilaterally, no wheezing, rales,rhonchi or crepitation. No use of accessory muscles of respiration.  CARDIOVASCULAR: S1, S2 normal. No murmurs, rubs, or gallops.  ABDOMEN: Soft, nontender, nondistended. Bowel sounds present. No organomegaly or mass.  EXTREMITIES: No pedal edema, cyanosis, or clubbing.  NEUROLOGIC: Cranial nerves II through XII are intact. Muscle strength 5/5 in all extremities. Sensation intact. Gait not checked.  PSYCHIATRIC: The patient is alert and oriented x 3.  SKIN: No obvious rash, lesion, or ulcer.   LABORATORY PANEL:   CBC  Recent Labs Lab 09/09/15 1414  WBC 7.8  HGB 13.8  HCT 41.9  PLT 220  MCV 86.9  MCH 28.6  MCHC 32.9  RDW 14.1   ------------------------------------------------------------------------------------------------------------------  Chemistries   Recent Labs Lab 09/09/15 1414  NA 134*  K 4.2  CL 103  CO2 25  GLUCOSE 281*  BUN 15  CREATININE 1.40*  CALCIUM 8.3*   ------------------------------------------------------------------------------------------------------------------ estimated creatinine clearance is 69.9 mL/min (by C-G formula based on Cr of 1.4). ------------------------------------------------------------------------------------------------------------------ No results for input(s): TSH, T4TOTAL, T3FREE, THYROIDAB in the last 72 hours.  Invalid input(s): FREET3   Coagulation profile No results for input(s): INR, PROTIME in the last 168 hours. ------------------------------------------------------------------------------------------------------------------- No results for input(s): DDIMER in the last 72  hours. -------------------------------------------------------------------------------------------------------------------  Cardiac Enzymes  Recent Labs Lab 09/09/15 1414  TROPONINI <0.03   ------------------------------------------------------------------------------------------------------------------ Invalid input(s): POCBNP  ---------------------------------------------------------------------------------------------------------------  Urinalysis    Component Value Date/Time   COLORURINE YELLOW* 09/09/2015 1450   APPEARANCEUR HAZY* 09/09/2015 1450   LABSPEC 1.027 09/09/2015 1450   PHURINE 5.0 09/09/2015 1450   GLUCOSEU 150* 09/09/2015 1450   HGBUR NEGATIVE 09/09/2015 1450   BILIRUBINUR NEGATIVE 09/09/2015 1450   KETONESUR NEGATIVE 09/09/2015 1450   PROTEINUR 30* 09/09/2015 1450   UROBILINOGEN 0.2 08/22/2013 0150   NITRITE NEGATIVE 09/09/2015 1450   LEUKOCYTESUR NEGATIVE 09/09/2015 1450     RADIOLOGY: No results found.  EKG: Orders placed or performed during the hospital encounter of 09/09/15  . ED EKG  . ED EKG    IMPRESSION AND PLAN: Patient is a 65 year old African-American male presents with near syncope  1. Near syncope: At this point will place patient on observation monitor him on off unit telemetry. Obtain echocardiogram of the heart carotid Dopplers. Will also do orthostatic vitals.  2. Diabetes type 2 continue metformin. Place him on sliding scale check a hemoglobin A1c  3. Hypertension continue amlodipine and losartan  4. Depression continue Zoloft  5. Insulin use Lovenox for DVT prophylaxis    All the records are reviewed and case discussed with ED provider. Management plans discussed with the patient, family and they are in agreement.  CODE STATUS: Code Status History    This patient does not have a recorded code status. Please follow your organizational policy for patients in this situation.  TOTAL TIME TAKING CARE OF THIS  PATIENT: 55 minutes.    Auburn Bilberry M.D on 09/09/2015 at 4:49 PM  Between 7am to 6pm - Pager - [phone]  After 6pm go to [email or web address] - password [email or web address] [facility]  [facility]  Office  [phone]  CC: Primary care physician; Michiel Sites, MD

## 2015-09-09 NOTE — ED Provider Notes (Signed)
[facility] Emergency Department Provider Note  Time seen: 2:21 PM  I have reviewed the triage vital signs and the nursing notes.   HISTORY  Chief Complaint Near Syncope    HPI Colin Weaver is a 65 y.o. male with a past medical history of hypertension, diabetes, presents to the emergency department after a syncopal episode. 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 Patient states it felt like his blood sugar had dropped HIV eating peanut butter and orange juice, without relief. Patient had to sit down because he felt like he is going to pass out and EMS was called. EMS states upon arrival the patient was profusely diaphoretic, blood pressure initially palpated at 80, bradycardic at 40 bpm. Patient denies any chest pain at any time. Denies any leg pain or swelling. Denies any history of syncope in the past. Upon arrival to the emergency department patient has a heart rate of 70 bpm, the pressure is normal, patient states he feels normal.     Past Medical History  Diagnosis Date  . Hypertension   . Diabetes mellitus without complication (HCC)   . Renal disorder     There are no active problems to display for this patient.   History reviewed. No pertinent past surgical history.  Current Outpatient Rx  Name  Route  Sig  Dispense  Refill  . ondansetron (ZOFRAN ODT) 8 MG disintegrating tablet   Oral   Take 1 tablet (8 mg total) by mouth every 8 (eight) hours as needed for nausea or vomiting.   20 tablet   0   . oxyCODONE-acetaminophen (PERCOCET/ROXICET) 5-325 MG per tablet   Oral   Take 2 tablets by mouth every 4 (four) hours as needed for severe pain.   20 tablet   0   . tamsulosin (FLOMAX) 0.4 MG CAPS capsule   Oral   Take 1 capsule (0.4 mg total) by mouth daily.   10 capsule   0     Allergies Prinivil  History reviewed. No pertinent family history.  Social  History Social History  Substance Use Topics  . Smoking status: Never Smoker   . Smokeless tobacco: None  . Alcohol Use: No    Review of Systems Constitutional: Negative for fever. Cardiovascular: Negative for chest pain. Respiratory: Negative for shortness of breath. Gastrointestinal: Negative for abdominal pain Neurological: Negative for headaches 10-point ROS otherwise negative.  ____________________________________________   PHYSICAL EXAM:  VITAL SIGNS: ED Triage Vitals  Enc Vitals Group     BP 09/09/15 1411 130/62 mmHg     Pulse Rate 09/09/15 1411 72     Resp 09/09/15 1411 20     Temp 09/09/15 1411 98.5 F (36.9 C)     Temp Source 09/09/15 1411 Oral     SpO2 09/09/15 1411 97 %     Weight 09/09/15 1411 276 lb 12.8 oz (125.556 kg)     Height 09/09/15 1411 5\' 9"  (1.753 m)     Head Cir --      Peak Flow --      Pain Score --      Pain Loc --      Pain Edu? --      Excl. in GC? --     Constitutional: Alert and oriented. Well appearing and in no distress. Eyes: Normal exam ENT   Head: Normocephalic and atraumatic.   Mouth/Throat: Mucous membranes are moist. Cardiovascular: Normal  rate, regular rhythm. No murmur Respiratory: Normal respiratory effort without tachypnea nor retractions. Breath sounds are clear Gastrointestinal: Soft and nontender. No distention.   Musculoskeletal: Nontender with normal range of motion in all extremities.  Neurologic:  Normal speech and language. No gross focal neurologic deficits Psychiatric: Mood and affect are normal. Speech and behavior are normal.   ____________________________________________    EKG  EKG reviewed and interpreted by myself shows normal sinus rhythm at 60 bpm. Prolonged PR interval, normal axis, widened QRS consistent with left bundle branch block.  ____________________________________________     INITIAL IMPRESSION / ASSESSMENT AND PLAN / ED COURSE  Pertinent labs & imaging results that were  available during my care of the patient were reviewed by me and considered in my medical decision making (see chart for details).  Patient presents the emergency department after a syncopal episode. Per EMS the patient had multiple syncopal episodes during their evaluation of the patient, with a blood pressure palpated at 80 systolic, pulse in the 40s. They did note some likely hypoxic jerking. Patient currently feels well, blood pressure is normal. No longer diaphoretic. We will check labs and closely monitor the patient in the emergency department on cardiac monitoring. Denies any stomach pains, chest pains, diarrhea, or any other symptoms that suggest a vagal episode. Concerning for possible cardiogenic syncope. EKG shows left bundle branch block, no old EKGs for comparison, patient denies ever hearing that he had a left bundle branch block. Denies any chest pain any time. Denies any shortness of breath at any time. Patient states he did feel quite nauseated during the episodes.  Patient's labs are largely within normal limits including negative troponin. However given the multiple syncopal episodes, with hypotension and bradycardia bleed the patient warrants admission for further monitoring. Patient is agreeable to this plan.  ____________________________________________   FINAL CLINICAL IMPRESSION(S) / ED DIAGNOSES  Syncope Hypotension Bradycardia  Minna Antis, MD 09/09/15 1630

## 2015-09-09 NOTE — ED Notes (Addendum)
Pt presents to ED from church via EMS. Per EMS, pt was preaching and became dizzy, weak, diaphoretic. Pt had a "brady" episode per EMS; initially pt was cold and BP was unobtainable. Blood sugar was 200 on arrival.Pt alert and oriented x4 on arrival.

## 2015-09-10 ENCOUNTER — Observation Stay
Admit: 2015-09-10 | Discharge: 2015-09-10 | Disposition: A | Discharge status: Home / Self Care | Payer: 59 | Attending: Internal Medicine | Admitting: Internal Medicine

## 2015-09-10 LAB — GLUCOSE, CAPILLARY
Glucose-Capillary: 147 mg/dL — ABNORMAL HIGH (ref 65–99)
Glucose-Capillary: 201 mg/dL — ABNORMAL HIGH (ref 65–99)

## 2015-09-10 MED ORDER — SODIUM CHLORIDE 0.9 % IV BOLUS (SEPSIS)
1000.0000 mL | Freq: Once | INTRAVENOUS | Status: AC
  Administered 2015-09-10: 1000 mL via INTRAVENOUS

## 2015-09-10 NOTE — Discharge Instructions (Signed)

## 2015-09-10 NOTE — Progress Notes (Signed)
Alert and oriented. Vital signs stable . No signs of acute distress. Discharge instructions given. Patient verbalizes understanding. No other issues noted at this time.   

## 2015-09-11 LAB — ECHOCARDIOGRAM COMPLETE
Height: 69 in
Weight: 4428.8 oz

## 2015-09-11 NOTE — Discharge Summary (Signed)
[facility] [place] [facility]   PATIENT NAME: Colin Weaver    MR#:  [id number]  DATE OF BIRTH:  Oct 18, 1950  DATE OF ADMISSION:  09/09/2015 ADMITTING PHYSICIAN: Auburn BilberryShreyang Patel, MD  DATE OF DISCHARGE: 09/10/2015 12:58 PM  PRIMARY CARE PHYSICIAN: Michiel SitesKOHUT,WALTER DENNIS, MD   ADMISSION DIAGNOSIS:  Bradycardia [R00.1] Syncope [R55] Hypotension, unspecified hypotension type [I95.9] Syncope, unspecified syncope type [R55]  DISCHARGE DIAGNOSIS:  Active Problems:   Syncope   SECONDARY DIAGNOSIS:   Past Medical History  Diagnosis Date  . Hypertension   . Diabetes mellitus without complication (HCC)   . Renal disorder      ADMITTING HISTORY   HISTORY OF PRESENT ILLNESS: Colin HailMark Culbreath is a 65 y.o. male with a known history of hypertension, diabetes type 2 who presents with a near syncope while at church. Patient felt like he was going go down on his feet. His church members came and helped him to the floor. They gave him juice since he is a diabetic. EMS was called. According to the witnesses he did pass out patient states that he is not sure if he passed out. When EMS arrived his blood sugar was 172. Patient reported that he's had a recent cough and cold congestion. And has to go over-the-counter medications for that. He did not have any chest pain or shortness of breath or palpitations has not had syncope he has had history of having hypoglycemia in the past. Feels well  HOSPITAL COURSE:   * Syncope Patient was admitted onto medical floor with telemetry monitoring for syncope. Orthostatics checked showed a drop in systolic blood pressure up to 29. He had a fluid bolus in the morning of discharge. Patient did not have any lightheadedness or dizziness on standing up prior to discharge. Felt back to normal. Patient was advised to keep himself hydrated to prevent any further dehydration and syncopal episodes. Echocardiogram showed normal ejection fraction and no  significant valvular abnormalities.  Advised to return to the emergency room if any further syncopal episodes which point patient may need a loop monitor.   CONSULTS OBTAINED:     DRUG ALLERGIES:   Allergies  Allergen Reactions  . Prinivil [Lisinopril] Cough    DISCHARGE MEDICATIONS:   Discharge Medication List as of 09/10/2015 11:48 AM    CONTINUE these medications which have NOT CHANGED   Details  amLODipine (NORVASC) 5 MG tablet Take 5 mg by mouth daily., Until Discontinued, Historical Med    ANDROGEL PUMP 20.25 MG/ACT (1.62%) GEL Apply 1 application topically daily. Apply to each shoulders as directed., Starting 09/08/2015, Until Discontinued, Historical Med    aspirin EC 81 MG tablet Take 81 mg by mouth daily., Until Discontinued, Historical Med    losartan (COZAAR) 50 MG tablet Take 50 mg by mouth daily., Until Discontinued, Historical Med    metFORMIN (GLUCOPHAGE) 500 MG tablet Take 1,000 mg by mouth daily. , Starting 08/25/2015, Until Discontinued, Historical Med    RAPAFLO 8 MG CAPS capsule Take 8 mg by mouth daily., Starting 08/25/2015, Until Discontinued, Historical Med    sertraline (ZOLOFT) 100 MG tablet Take 100 mg by mouth daily., Starting 09/08/2015, Until Discontinued, Historical Med    ondansetron (ZOFRAN ODT) 8 MG disintegrating tablet Take 1 tablet (8 mg total) by mouth every 8 (eight) hours as needed for nausea or vomiting., Starting 08/22/2013, Until Discontinued, Print    oxyCODONE-acetaminophen (PERCOCET/ROXICET) 5-325 MG per tablet Take 2 tablets by mouth every 4 (four) hours as needed  for severe pain., Starting 08/22/2013, Until Discontinued, Print        Today   VITAL SIGNS:  Blood pressure 106/70, pulse 84, temperature 98.4 F (36.9 C), temperature source Oral, resp. rate 19, height  (1.753 m), weight 125.556 kg (276 lb 12.8 oz), SpO2 97 %.  I/O:  No intake or output data in the 24 hours ending 09/11/15 1447  PHYSICAL EXAMINATION:  Physical  Exam  GENERAL:  65 y.o.-year-old patient lying in the bed with no acute distress.  LUNGS: Normal breath sounds bilaterally, no wheezing, rales,rhonchi or crepitation. No use of accessory muscles of respiration.  CARDIOVASCULAR: S1, S2 normal. No murmurs, rubs, or gallops.  ABDOMEN: Soft, non-tender, non-distended. Bowel sounds present. No organomegaly or mass.  NEUROLOGIC: Moves all 4 extremities. PSYCHIATRIC: The patient is alert and oriented x 3.  SKIN: No obvious rash, lesion, or ulcer.   DATA REVIEW:   CBC  Recent Labs Lab 09/09/15 1414  WBC 7.8  HGB 13.8  HCT 41.9  PLT 220    Chemistries   Recent Labs Lab 09/09/15 1414  NA 134*  K 4.2  CL 103  CO2 25  GLUCOSE 281*  BUN 15  CREATININE 1.40*  CALCIUM 8.3*    Cardiac Enzymes  Recent Labs Lab 09/09/15 1414  TROPONINI <0.03    Microbiology Results  No results found for this or any previous visit.  RADIOLOGY:  US Carotid Bilateral  09/09/2015  CLINICAL DATA:  Syncope for 1 week EXAM: BILATERAL CAROTID DUPLEX ULTRASOUND TECHNIQUE: Wallace Cullens scale imaging, color Doppler and duplex ultrasound were performed of bilateral carotid and vertebral arteries in the neck. COMPARISON:  None. FINDINGS: Criteria: Quantification of carotid stenosis is based on velocity parameters that correlate the residual internal carotid diameter with NASCET-based stenosis levels, using the diameter of the distal internal carotid lumen as the denominator for stenosis measurement. The following velocity measurements were obtained: RIGHT ICA:  78/21 cm/sec CCA:  115/22 cm/sec SYSTOLIC ICA/CCA RATIO:  0.7 DIASTOLIC ICA/CCA RATIO:  1.0 ECA:  90 cm/sec LEFT ICA:  60/19 cm/sec CCA:  134/35 cm/sec SYSTOLIC ICA/CCA RATIO:  0.44 DIASTOLIC ICA/CCA RATIO:  0.54 ECA:  103 cm/sec RIGHT CAROTID ARTERY: Grayscale images demonstrate no significant atherosclerotic plaque formation. The waveforms, velocities and flow velocity ratios are within normal limits. RIGHT  VERTEBRAL ARTERY:  Antegrade in nature. LEFT CAROTID ARTERY: Grayscale images show no significant atherosclerotic plaque formation. The waveforms, velocities and flow velocity ratios show no focal stenosis. LEFT VERTEBRAL ARTERY:  Antegrade in nature. IMPRESSION: No evidence of focal hemodynamically significant stenosis. Electronically Signed   By: Alcide Clever M.D.   On: 09/09/2015 17:50    Follow up with PCP in 1 week.  Management plans discussed with the patient, family and they are in agreement.  CODE STATUS:  Code Status History    Date Active Date Inactive Code Status Order ID Comments User Context   09/09/2015  6:34 PM 09/10/2015  3:59 PM Full Code [id number]  Auburn Bilberry, MD Inpatient      TOTAL TIME TAKING CARE OF THIS PATIENT ON DAY OF DISCHARGE: more than 30 minutes.   Milagros Loll R M.D on 09/11/2015 at 2:47 PM  Between 7am to 6pm - Pager - [phone]  After 6pm go to [email or web address] - password [email or web address]  [facility]  Office  [phone]  CC: Primary care physician; Michiel Sites, MD  Note: This dictation was prepared with Dragon dictation along with smaller phrase technology. Any transcriptional errors that  result from this process are unintentional.

## 2018-11-20 ENCOUNTER — Inpatient Hospital Stay (HOSPITAL_COMMUNITY)
Admission: EM | Admit: 2018-11-20 | Discharge: 2018-11-24 | DRG: 246 | Disposition: A | Discharge status: Home / Self Care | Payer: 59 | Attending: Internal Medicine | Admitting: Internal Medicine

## 2018-11-20 ENCOUNTER — Encounter (HOSPITAL_COMMUNITY): Payer: Self-pay

## 2018-11-20 ENCOUNTER — Encounter (HOSPITAL_COMMUNITY): Payer: Self-pay | Admitting: Emergency Medicine

## 2018-11-20 ENCOUNTER — Other Ambulatory Visit: Payer: Self-pay

## 2018-11-20 ENCOUNTER — Ambulatory Visit (HOSPITAL_COMMUNITY): Admit: 2018-11-20 | Payer: 59 | Admitting: Cardiology

## 2018-11-20 ENCOUNTER — Emergency Department (HOSPITAL_COMMUNITY): Payer: 59

## 2018-11-20 DIAGNOSIS — I255 Ischemic cardiomyopathy: Secondary | ICD-10-CM | POA: Diagnosis present

## 2018-11-20 DIAGNOSIS — Z888 Allergy status to other drugs, medicaments and biological substances status: Secondary | ICD-10-CM | POA: Diagnosis not present

## 2018-11-20 DIAGNOSIS — Z955 Presence of coronary angioplasty implant and graft: Secondary | ICD-10-CM

## 2018-11-20 DIAGNOSIS — I10 Essential (primary) hypertension: Secondary | ICD-10-CM | POA: Diagnosis not present

## 2018-11-20 DIAGNOSIS — Z1159 Encounter for screening for other viral diseases: Secondary | ICD-10-CM

## 2018-11-20 DIAGNOSIS — E669 Obesity, unspecified: Secondary | ICD-10-CM | POA: Diagnosis present

## 2018-11-20 DIAGNOSIS — Z7984 Long term (current) use of oral hypoglycemic drugs: Secondary | ICD-10-CM

## 2018-11-20 DIAGNOSIS — I2510 Atherosclerotic heart disease of native coronary artery without angina pectoris: Secondary | ICD-10-CM | POA: Diagnosis present

## 2018-11-20 DIAGNOSIS — E785 Hyperlipidemia, unspecified: Secondary | ICD-10-CM | POA: Diagnosis present

## 2018-11-20 DIAGNOSIS — N400 Enlarged prostate without lower urinary tract symptoms: Secondary | ICD-10-CM | POA: Diagnosis present

## 2018-11-20 DIAGNOSIS — E119 Type 2 diabetes mellitus without complications: Secondary | ICD-10-CM

## 2018-11-20 DIAGNOSIS — Z79899 Other long term (current) drug therapy: Secondary | ICD-10-CM

## 2018-11-20 DIAGNOSIS — I130 Hypertensive heart and chronic kidney disease with heart failure and stage 1 through stage 4 chronic kidney disease, or unspecified chronic kidney disease: Secondary | ICD-10-CM | POA: Diagnosis present

## 2018-11-20 DIAGNOSIS — E1122 Type 2 diabetes mellitus with diabetic chronic kidney disease: Secondary | ICD-10-CM | POA: Diagnosis present

## 2018-11-20 DIAGNOSIS — I214 Non-ST elevation (NSTEMI) myocardial infarction: Secondary | ICD-10-CM | POA: Diagnosis present

## 2018-11-20 DIAGNOSIS — I5021 Acute systolic (congestive) heart failure: Secondary | ICD-10-CM | POA: Diagnosis present

## 2018-11-20 DIAGNOSIS — Z833 Family history of diabetes mellitus: Secondary | ICD-10-CM

## 2018-11-20 DIAGNOSIS — I249 Acute ischemic heart disease, unspecified: Secondary | ICD-10-CM | POA: Diagnosis present

## 2018-11-20 DIAGNOSIS — Z7982 Long term (current) use of aspirin: Secondary | ICD-10-CM

## 2018-11-20 DIAGNOSIS — N2889 Other specified disorders of kidney and ureter: Secondary | ICD-10-CM | POA: Diagnosis present

## 2018-11-20 DIAGNOSIS — Z6838 Body mass index (BMI) 38.0-38.9, adult: Secondary | ICD-10-CM

## 2018-11-20 DIAGNOSIS — E661 Drug-induced obesity: Secondary | ICD-10-CM | POA: Diagnosis not present

## 2018-11-20 DIAGNOSIS — N182 Chronic kidney disease, stage 2 (mild): Secondary | ICD-10-CM | POA: Diagnosis present

## 2018-11-20 DIAGNOSIS — R002 Palpitations: Secondary | ICD-10-CM | POA: Diagnosis not present

## 2018-11-20 LAB — BASIC METABOLIC PANEL
Anion gap: 11 (ref 5–15)
BUN: 13 mg/dL (ref 8–23)
CO2: 24 mmol/L (ref 22–32)
Calcium: 9.7 mg/dL (ref 8.9–10.3)
Chloride: 108 mmol/L (ref 98–111)
Creatinine, Ser: 1.58 mg/dL — ABNORMAL HIGH (ref 0.61–1.24)
GFR calc Af Amer: 52 mL/min — ABNORMAL LOW (ref >60)
GFR calc non Af Amer: 45 mL/min — ABNORMAL LOW (ref >60)
Glucose, Bld: 192 mg/dL — ABNORMAL HIGH (ref 70–99)
Potassium: 3.9 mmol/L (ref 3.5–5.1)
Sodium: 143 mmol/L (ref 135–145)

## 2018-11-20 LAB — TROPONIN I
Troponin I: 0.03 ng/mL (ref <0.03)
Troponin I: 0.3 ng/mL (ref <0.03)

## 2018-11-20 LAB — CBC WITH DIFFERENTIAL/PLATELET
Abs Immature Granulocytes: 0.04 10*3/uL (ref 0.00–0.07)
Basophils Absolute: 0.1 10*3/uL (ref 0.0–0.1)
Basophils Relative: 1 %
Eosinophils Absolute: 0.1 10*3/uL (ref 0.0–0.5)
Eosinophils Relative: 1 %
HCT: 48.3 % (ref 39.0–52.0)
Hemoglobin: 15.8 g/dL (ref 13.0–17.0)
Immature Granulocytes: 0 %
Lymphocytes Relative: 38 %
Lymphs Abs: 4 10*3/uL (ref 0.7–4.0)
MCH: 29 pg (ref 26.0–34.0)
MCHC: 32.7 g/dL (ref 30.0–36.0)
MCV: 88.6 fL (ref 80.0–100.0)
Monocytes Absolute: 0.8 10*3/uL (ref 0.1–1.0)
Monocytes Relative: 7 %
Neutro Abs: 5.7 10*3/uL (ref 1.7–7.7)
Neutrophils Relative %: 53 %
Platelets: 275 10*3/uL (ref 150–400)
RBC: 5.45 MIL/uL (ref 4.22–5.81)
RDW: 12.8 % (ref 11.5–15.5)
WBC: 10.6 10*3/uL — ABNORMAL HIGH (ref 4.0–10.5)
nRBC: 0 % (ref 0.0–0.2)

## 2018-11-20 LAB — CBG MONITORING, ED: Glucose-Capillary: 161 mg/dL — ABNORMAL HIGH (ref 70–99)

## 2018-11-20 LAB — PROTIME-INR
INR: 1 (ref 0.8–1.2)
Prothrombin Time: 13.3 seconds (ref 11.4–15.2)

## 2018-11-20 LAB — HEPATIC FUNCTION PANEL
ALT: 29 U/L (ref 0–44)
AST: 21 U/L (ref 15–41)
Albumin: 4 g/dL (ref 3.5–5.0)
Alkaline Phosphatase: 46 U/L (ref 38–126)
Bilirubin, Direct: <0.1 mg/dL (ref 0.0–0.2)
Total Bilirubin: 0.7 mg/dL (ref 0.3–1.2)
Total Protein: 7.6 g/dL (ref 6.5–8.1)

## 2018-11-20 LAB — BRAIN NATRIURETIC PEPTIDE: B Natriuretic Peptide: 28.2 pg/mL (ref 0.0–100.0)

## 2018-11-20 LAB — GLUCOSE, CAPILLARY: Glucose-Capillary: 122 mg/dL — ABNORMAL HIGH (ref 70–99)

## 2018-11-20 LAB — SARS CORONAVIRUS 2 BY RT PCR (HOSPITAL ORDER, PERFORMED IN ~~LOC~~ HOSPITAL LAB): SARS Coronavirus 2: NEGATIVE

## 2018-11-20 LAB — APTT: aPTT: 29 seconds (ref 24–36)

## 2018-11-20 SURGERY — CORONARY/GRAFT ACUTE MI REVASCULARIZATION
Anesthesia: LOCAL

## 2018-11-20 MED ORDER — HEPARIN BOLUS VIA INFUSION
4000.0000 [IU] | Freq: Once | INTRAVENOUS | Status: AC
  Administered 2018-11-20: 4000 [IU] via INTRAVENOUS
  Filled 2018-11-20: qty 4000

## 2018-11-20 MED ORDER — ONDANSETRON HCL 4 MG/2ML IJ SOLN: 4.0000 mg | Freq: Four times a day (QID) | INTRAMUSCULAR | Status: DC | PRN

## 2018-11-20 MED ORDER — METOPROLOL SUCCINATE ER 25 MG PO TB24
25.0000 mg | ORAL_TABLET | Freq: Every day | ORAL | Status: DC
  Administered 2018-11-20 – 2018-11-24 (×5): 25 mg via ORAL
  Filled 2018-11-20 (×5): qty 1

## 2018-11-20 MED ORDER — HEPARIN (PORCINE) 25000 UT/250ML-% IV SOLN: 12.0000 [IU]/kg/h | INTRAVENOUS | Status: DC

## 2018-11-20 MED ORDER — ASPIRIN EC 81 MG PO TBEC
81.0000 mg | DELAYED_RELEASE_TABLET | Freq: Every day | ORAL | Status: DC
  Administered 2018-11-21 – 2018-11-24 (×4): 81 mg via ORAL
  Filled 2018-11-20 (×4): qty 1

## 2018-11-20 MED ORDER — POTASSIUM CHLORIDE IN NACL 20-0.45 MEQ/L-% IV SOLN
INTRAVENOUS | Status: AC
  Administered 2018-11-21: via INTRAVENOUS
  Filled 2018-11-20 (×2): qty 1000

## 2018-11-20 MED ORDER — AMLODIPINE BESYLATE 5 MG PO TABS
5.0000 mg | ORAL_TABLET | Freq: Every day | ORAL | Status: DC
  Administered 2018-11-21 – 2018-11-24 (×4): 5 mg via ORAL
  Filled 2018-11-20: qty 2
  Filled 2018-11-20: qty 1
  Filled 2018-11-20 (×2): qty 2

## 2018-11-20 MED ORDER — HEPARIN SODIUM (PORCINE) 5000 UNIT/ML IJ SOLN: 60.0000 [IU]/kg | Freq: Once | INTRAMUSCULAR | Status: DC

## 2018-11-20 MED ORDER — LACTATED RINGERS IV BOLUS
500.0000 mL | Freq: Once | INTRAVENOUS | Status: AC
  Administered 2018-11-20: 500 mL via INTRAVENOUS

## 2018-11-20 MED ORDER — MORPHINE SULFATE (PF) 2 MG/ML IV SOLN: 2.0000 mg | INTRAVENOUS | Status: DC | PRN

## 2018-11-20 MED ORDER — HEPARIN (PORCINE) 25000 UT/250ML-% IV SOLN
1400.0000 [IU]/h | INTRAVENOUS | Status: DC
  Administered 2018-11-20: 1200 [IU]/h via INTRAVENOUS
  Administered 2018-11-21: 1450 [IU]/h via INTRAVENOUS
  Filled 2018-11-20 (×4): qty 250

## 2018-11-20 MED ORDER — LOSARTAN POTASSIUM 50 MG PO TABS
50.0000 mg | ORAL_TABLET | Freq: Every evening | ORAL | Status: DC
  Administered 2018-11-21: 50 mg via ORAL
  Filled 2018-11-20: qty 1

## 2018-11-20 MED ORDER — PRAVASTATIN SODIUM 40 MG PO TABS
80.0000 mg | ORAL_TABLET | Freq: Every day | ORAL | Status: DC
  Administered 2018-11-21: 80 mg via ORAL
  Filled 2018-11-20: qty 2

## 2018-11-20 MED ORDER — ACETAMINOPHEN 325 MG PO TABS: 650.0000 mg | ORAL_TABLET | ORAL | Status: DC | PRN

## 2018-11-20 MED ORDER — TAMSULOSIN HCL 0.4 MG PO CAPS
0.4000 mg | ORAL_CAPSULE | Freq: Every day | ORAL | Status: DC
  Administered 2018-11-21 – 2018-11-24 (×4): 0.4 mg via ORAL
  Filled 2018-11-20 (×4): qty 1

## 2018-11-20 MED ORDER — NITROGLYCERIN 0.4 MG SL SUBL: 0.4000 mg | SUBLINGUAL_TABLET | SUBLINGUAL | Status: DC | PRN

## 2018-11-20 MED ORDER — INSULIN ASPART 100 UNIT/ML ~~LOC~~ SOLN
0.0000 [IU] | SUBCUTANEOUS | Status: DC
  Administered 2018-11-20 – 2018-11-21 (×2): 1 [IU] via SUBCUTANEOUS
  Administered 2018-11-22: 2 [IU] via SUBCUTANEOUS

## 2018-11-20 NOTE — H&P (Signed)
History and Physical    Colin Weaver ZOX:[id number] DOB: [birth year] DOA: 11/20/2018  PCP: Darci Needle, MD   Patient coming from: Home   Chief Complaint: Chest discomfort, diaphoresis, lightheaded   HPI: Colin Weaver is a 68 y.o. male with medical history significant for hypertension, type 2 diabetes mellitus, and mild chronic renal insufficiency, now presenting to the emergency department after an acute episode of chest discomfort, diaphoresis, and lightheadedness.  Patient reports that he woke in his usual state of health, was out doing yard work, and then developed an intense discomfort in his chest that he has difficulty describing, lightheadedness, and was reportedly diaphoretic.  Patient laid down at home, took his evening medications, but his symptoms persisted and EMS was called.  Patient was reportedly hypotensive with EMS, had LBBB on EKG, and was brought in as code STEMI.  He was given full dose aspirin prior to arrival.  ED Course: Upon arrival to the ED, patient is found to be saturating well on room air, normal heart rate and blood pressure, no respiratory distress, but appeared uncomfortable and diaphoretic per ED physician report.  EKG features a sinus rhythm with LBBB.  Chest x-ray is negative for acute cardiopulmonary disease.  Chemistry panel is notable for glucose 192 and creatinine 1.58.  CBC features a slight leukocytosis.  Troponin is slightly elevated 0.03, increasing to 0.30 after 2 hours.  Patient was given a 500 cc normal saline bolus and started on IV heparin infusion.  He was evaluated by cardiology, EKG was felt to be not significantly different than priors, and medical admission was recommended.  Review of Systems:  All other systems reviewed and apart from HPI, are negative.  Past Medical History:  Diagnosis Date  . Diabetes mellitus without complication (HCC)   . Hypertension   . Renal disorder     Past Surgical History:  Procedure Laterality Date  .  CYSTOSCOPY       reports that he has never smoked. He has never used smokeless tobacco. He reports that he does not drink alcohol or use drugs.  Allergies  Allergen Reactions  . Lisinopril Cough    Allergy or intolerance (??)    Family History  Problem Relation Age of Onset  . Diabetes Mother      Prior to Admission medications   Medication Sig Start Date End Date Taking? Authorizing Provider  amLODipine (NORVASC) 5 MG tablet Take 5 mg by mouth daily.   Yes [provider]  ANDROGEL PUMP 20.25 MG/ACT (1.62%) GEL Apply 1 application topically See admin instructions. Apply to each shoulder daily as directed 09/08/15  Yes [provider]  aspirin EC 81 MG tablet Take 81 mg by mouth daily.   Yes [provider]  losartan (COZAAR) 50 MG tablet Take 50 mg by mouth every evening.    Yes [provider]  meloxicam (MOBIC) 15 MG tablet Take 15 mg by mouth daily as needed for pain.   Yes [provider]  metFORMIN (GLUCOPHAGE) 500 MG tablet Take 1,000 mg by mouth daily.  08/25/15  Yes [provider]  pravastatin (PRAVACHOL) 80 MG tablet Take 80 mg by mouth daily. 12/12/16  Yes [provider]  RAPAFLO 8 MG CAPS capsule Take 8 mg by mouth daily. 08/25/15  Yes [provider]    Physical Exam: Vitals:   11/20/18 2045 11/20/18 2100 11/20/18 2115 11/20/18 2149  BP: 126/81 131/80 (!) 141/81 128/88  Pulse: 80 72 71 72  Resp:  SpO2: 97% 98% 97% 99%  Weight:  111.1 kg    Height:   (1.702 m)      Constitutional: NAD, calm  Eyes: PERTLA, lids and conjunctivae normal ENMT: Mucous membranes are moist. Posterior pharynx clear of any exudate or lesions.   Neck: normal, supple, no masses, no thyromegaly Respiratory:  no wheezing, no crackles. Normal respiratory effort. No accessory muscle use.  Cardiovascular: S1 & S2 heard, regular rate and rhythm. No extremity edema. No significant JVD. Abdomen: No  distension, no tenderness, soft. Bowel sounds normal.  Musculoskeletal: no clubbing / cyanosis. No joint deformity upper and lower extremities.    Skin: no significant rashes, lesions, ulcers. Warm, dry, well-perfused. Neurologic: CN 2-12 grossly intact. Sensation intact. Strength 5/5 in all 4 limbs.  Psychiatric: Alert and oriented x 3. Very pleasant, cooperative.    Labs on Admission: I have personally reviewed following labs and imaging studies  CBC: Recent Labs  Lab 11/20/18 1737  WBC 10.6*  NEUTROABS 5.7  HGB 15.8  HCT 48.3  MCV 88.6  PLT 275   Basic Metabolic Panel: Recent Labs  Lab 11/20/18 1737  NA 143  K 3.9  CL 108  CO2 24  GLUCOSE 192*  BUN 13  CREATININE 1.58*  CALCIUM 9.7   GFR: Estimated Creatinine Clearance: 54 mL/min (A) (by C-G formula based on SCr of 1.58 mg/dL (H)). Liver Function Tests: Recent Labs  Lab 11/20/18 1737  AST 21  ALT 29  ALKPHOS 46  BILITOT 0.7  PROT 7.6  ALBUMIN 4.0   No results for input(s): LIPASE, AMYLASE in the last 168 hours. No results for input(s): AMMONIA in the last 168 hours. Coagulation Profile: Recent Labs  Lab 11/20/18 1737  INR 1.0   Cardiac Enzymes: Recent Labs  Lab 11/20/18 1737 11/20/18 1951  TROPONINI 0.03* 0.30*   BNP (last 3 results) No results for input(s): PROBNP in the last 8760 hours. HbA1C: No results for input(s): HGBA1C in the last 72 hours. CBG: Recent Labs  Lab 11/20/18 1737  GLUCAP 161*   Lipid Profile: No results for input(s): CHOL, HDL, LDLCALC, TRIG, CHOLHDL, LDLDIRECT in the last 72 hours. Thyroid Function Tests: No results for input(s): TSH, T4TOTAL, FREET4, T3FREE, THYROIDAB in the last 72 hours. Anemia Panel: No results for input(s): VITAMINB12, FOLATE, FERRITIN, TIBC, IRON, RETICCTPCT in the last 72 hours. Urine analysis:    Component Value Date/Time   COLORURINE YELLOW (A) 09/09/2015 1450   APPEARANCEUR HAZY (A) 09/09/2015 1450   LABSPEC 1.027 09/09/2015 1450    PHURINE 5.0 09/09/2015 1450   GLUCOSEU 150 (A) 09/09/2015 1450   HGBUR NEGATIVE 09/09/2015 1450   BILIRUBINUR NEGATIVE 09/09/2015 1450   KETONESUR NEGATIVE 09/09/2015 1450   PROTEINUR 30 (A) 09/09/2015 1450   UROBILINOGEN 0.2 08/22/2013 0150   NITRITE NEGATIVE 09/09/2015 1450   LEUKOCYTESUR NEGATIVE 09/09/2015 1450   Sepsis Labs: (procalcitonin:4,lacticidven:4) ) Recent Results (from the past 240 hour(s))  SARS Coronavirus 2 (CEPHEID - Performed in [facility] hospital lab), Hosp Order     Status: None   Collection Time: 11/20/18  6:54 PM  Result Value Ref Range Status   SARS Coronavirus 2 NEGATIVE NEGATIVE Final    Comment: (NOTE) If result is NEGATIVE SARS-CoV-2 target nucleic acids are NOT DETECTED. The SARS-CoV-2 RNA is generally detectable in upper and lower  respiratory specimens during the acute phase of infection. The lowest  concentration of SARS-CoV-2 viral copies this assay can detect is 250  copies / mL. A negative result does not preclude SARS-CoV-2 infection  and should not be used as the sole basis for treatment or other  patient management decisions.  A negative result may occur with  improper specimen collection / handling, submission of specimen other  than nasopharyngeal swab, presence of viral mutation(s) within the  areas targeted by this assay, and inadequate number of viral copies  (<250 copies / mL). A negative result must be combined with clinical  observations, patient history, and epidemiological information. If result is POSITIVE SARS-CoV-2 target nucleic acids are DETECTED. The SARS-CoV-2 RNA is generally detectable in upper and lower  respiratory specimens dur ing the acute phase of infection.  Positive  results are indicative of active infection with SARS-CoV-2.  Clinical  correlation with patient history and other diagnostic information is  necessary to determine patient infection status.  Positive results do  not rule out bacterial  infection or co-infection with other viruses. If result is PRESUMPTIVE POSTIVE SARS-CoV-2 nucleic acids MAY BE PRESENT.   A presumptive positive result was obtained on the submitted specimen  and confirmed on repeat testing.  While 2019 novel coronavirus  (SARS-CoV-2) nucleic acids may be present in the submitted sample  additional confirmatory testing may be necessary for epidemiological  and / or clinical management purposes  to differentiate between  SARS-CoV-2 and other Sarbecovirus currently known to infect humans.  If clinically indicated additional testing with an alternate test  methodology [phone]) is advised. The SARS-CoV-2 RNA is generally  detectable in upper and lower respiratory sp ecimens during the acute  phase of infection. The expected result is Negative. Fact Sheet for Patients:  BoilerBrush.com.cy Fact Sheet for Healthcare Providers: https://pope.com/ This test is not yet approved or cleared by the [place] FDA and has been authorized for detection and/or diagnosis of SARS-CoV-2 by FDA under an Emergency Use Authorization (EUA).  This EUA will remain in effect (meaning this test can be used) for the duration of the COVID-19 declaration under Section 564(b)(1) of the Act, 21 U.S.C. section 360bbb-3(b)(1), unless the authorization is terminated or revoked sooner. Performed at [facility], [address], [place], [place] [address]      Radiological Exams on Admission: Dg Chest Portable 1 View  Result Date: 11/20/2018 CLINICAL DATA:  Chest pain. EXAM: PORTABLE CHEST 1 VIEW COMPARISON:  None. FINDINGS: The heart size and mediastinal contours are within normal limits. Both lungs are clear. The visualized skeletal structures are unremarkable. IMPRESSION: No active disease. Electronically Signed   By: Obie Dredge M.D.   On: 11/20/2018 18:12    EKG: Independently reviewed. Sinus rhythm, LBBB.    Assessment/Plan   1. Non-STEMI  - Presents with acute chest discomfort, diaphoresis, and lightheadedness after doing yard work, was treated with full-dose ASA and NTG prior to arrival, denies chest pain in ED and has been hemodynamically stable  - Initial troponin is 0.03, increasing to 0.30 in ~2 hrs  - EKG with LBBB, reviewed by cardiology and not felt to be significantly different than priors  - CXR unremarkable  - Started on IV heparin in ED  - Already took his statin this evening, given ASA pta, continue IV heparin, trend troponin and EKG's, start low-dose metoprolol as tolerated, continue ASA and statin    2. Hypertension  - Was reportedly hypotensive with EMS, has remained hemodynamcially stable here and now slightly hypertensive  - Start low-dose metoprolol in setting of NSTEMI, continue losartan and Norvasc as tolerated  3. Type II DM  - No A1c on file  - Managed with metformin at home, held on admission  - Use low-intensity SSI with insulin while in hospital   4. CKD stage II-III  - SCr is 1.58 on admission, up from 1.4 on most recent prior labs from 2017  - Renally-dose medications, monitor     PPE: Mask, face shield  DVT prophylaxis: IV heparin  Code Status: Full  Family Communication: Discussed with patient  Consults called: Cardiology evaluated patient for potential STEMI  Admission status: Inpatient     Briscoe Deutscherimothy S Braxten Memmer, MD [facility] Pager [phone]  If 7PM-7AM, please contact night-coverage [email or web address] Password [email or web address]  11/20/2018, 9:54 PM

## 2018-11-20 NOTE — Progress Notes (Signed)
ANTICOAGULATION CONSULT NOTE  Pharmacy Consult for heparin Indication: chest pain/ACS  Heparin Dosing Weight: 91.2 kg  Labs: Recent Labs    11/20/18 1737 11/20/18 1951  HGB 15.8  --   HCT 48.3  --   PLT 275  --   APTT 29  --   LABPROT 13.3  --   INR 1.0  --   CREATININE 1.58*  --   TROPONINI 0.03* 0.30*    Assessment: 68 yom presenting with CP. Pharmacy consulted to dose heparin for ACS. Not on anticoagulation PTA. CBC WNL, troponin 0.3. No active bleed issues documented. Per patient, weight is ~245 lbs and height is 5'7"  Goal of Therapy:  Heparin level 0.3-0.7 units/ml Monitor platelets by anticoagulation protocol: Yes   Plan:  Heparin 4000 unit bolus Start heparin at 1200 units/h 6h heparin level Daily heparin level/CBC Monitor s/sx bleeding  Babs Bertin, PharmD, BCPS Clinical Pharmacist 11/20/2018 9:21 PM

## 2018-11-20 NOTE — Consult Note (Signed)
Responded to page for STEMI. No family present. Staff will page again if further services needed. Standing by.  Rev. Donnel Saxon Chaplain

## 2018-11-20 NOTE — ED Notes (Signed)
ED TO INPATIENT HANDOFF REPORT  ED Nurse Name and Phone #: [phone]  S Name/Age/Gender Colin Weaver 68 y.o. male Room/Bed: 013C/013C  Code Status   Code Status: Full Code  Home/SNF/Other Home Patient oriented to: self, place, time and situation Is this baseline? Yes   Triage Complete: Triage complete  Chief Complaint STEMI  Triage Note Pt here as a Stemi , pt was mowing grass and was on his way home and developed some nausea , went to fire station , b/p 65/0 , s b/p up to 155 on arrival , no chest pain , 324 mg asa given    Allergies Allergies  Allergen Reactions  . Lisinopril Cough    Allergy or intolerance (??)    Level of Care/Admitting Diagnosis ED Disposition    ED Disposition Condition Comment   Admit  Hospital Area: [facility] [[id number]]  Level of Care: Telemetry Cardiac [103]  Covid Evaluation: Screening Protocol (No Symptoms)  Diagnosis: NSTEMI (non-ST elevated myocardial infarction) [facility]) [[id number]]  Admitting Physician: Briscoe Deutscher [[id number]]  Attending Physician: Briscoe Deutscher [[id number]]  Estimated length of stay: past midnight tomorrow  Certification:: I certify this patient will need inpatient services for at least 2 midnights  PT Class (Do Not Modify): Inpatient [101]  PT Acc Code (Do Not Modify): Private [1]       B Medical/Surgery History Past Medical History:  Diagnosis Date  . Diabetes mellitus without complication (HCC)   . Hypertension   . Renal disorder    Past Surgical History:  Procedure Laterality Date  . CYSTOSCOPY       A IV Location/Drains/Wounds Patient Lines/Drains/Airways Status   Active Line/Drains/Airways    Name:   Placement date:   Placement time:   Site:   Days:   Peripheral IV 11/20/18 Left Antecubital   11/20/18    1731    Antecubital   less than 1   Peripheral IV 11/20/18 Right Antecubital   11/20/18    -    Antecubital   less than 1          Intake/Output Last 24 hours No intake or  output data in the 24 hours ending 11/20/18 2224  Labs/Imaging Results for orders placed or performed during the hospital encounter of 11/20/18 (from the past 48 hour(s))  Troponin I - ONCE - STAT     Status: Abnormal   Collection Time: 11/20/18  5:37 PM  Result Value Ref Range   Troponin I 0.03 (HH) <0.03 ng/mL    Comment: CRITICAL RESULT CALLED TO, READ BACK BY AND VERIFIED WITHLeta Speller 1908 11/20/2018 WBONS Performed at [facility], [address], [place], [place] [address]   Brain natriuretic peptide     Status: None   Collection Time: 11/20/18  5:37 PM  Result Value Ref Range   B Natriuretic Peptide 28.2 0.0 - 100.0 pg/mL    Comment: Performed at [facility], [address] [place], [place] [address]  CBC with Differential     Status: Abnormal   Collection Time: 11/20/18  5:37 PM  Result Value Ref Range   WBC 10.6 (H) 4.0 - 10.5 K/uL   RBC 5.45 4.22 - 5.81 MIL/uL   Hemoglobin 15.8 13.0 - 17.0 g/dL   HCT 29.5 28.4 - 13.2 %   MCV 88.6 80.0 - 100.0 fL   MCH 29.0 26.0 - 34.0 pg   MCHC 32.7 30.0 - 36.0 g/dL   RDW 44.0 10.2 -  15.5 %   Platelets 275 150 - 400 K/uL   nRBC 0.0 0.0 - 0.2 %   Neutrophils Relative % 53 %   Neutro Abs 5.7 1.7 - 7.7 K/uL   Lymphocytes Relative 38 %   Lymphs Abs 4.0 0.7 - 4.0 K/uL   Monocytes Relative 7 %   Monocytes Absolute 0.8 0.1 - 1.0 K/uL   Eosinophils Relative 1 %   Eosinophils Absolute 0.1 0.0 - 0.5 K/uL   Basophils Relative 1 %   Basophils Absolute 0.1 0.0 - 0.1 K/uL   Immature Granulocytes 0 %   Abs Immature Granulocytes 0.04 0.00 - 0.07 K/uL    Comment: Performed at [facility], [address] [place], [place] [address]  Basic metabolic panel     Status: Abnormal   Collection Time: 11/20/18  5:37 PM  Result Value Ref Range   Sodium 143 135 - 145 mmol/L   Potassium 3.9 3.5 - 5.1 mmol/L   Chloride 108 98 - 111 mmol/L   CO2 24 22 - 32 mmol/L   Glucose, Bld 192 (H) 70 - 99 mg/dL   BUN 13 8 - 23 mg/dL    Creatinine, Ser 9.56 (H) 0.61 - 1.24 mg/dL   Calcium 9.7 8.9 - 21.3 mg/dL   GFR calc non Af Amer 45 (L) >60 mL/min   GFR calc Af Amer 52 (L) >60 mL/min   Anion gap 11 5 - 15    Comment: Performed at [facility], [address], [place], [place] [address]  Hepatic function panel     Status: None   Collection Time: 11/20/18  5:37 PM  Result Value Ref Range   Total Protein 7.6 6.5 - 8.1 g/dL   Albumin 4.0 3.5 - 5.0 g/dL   AST 21 15 - 41 U/L   ALT 29 0 - 44 U/L   Alkaline Phosphatase 46 38 - 126 U/L   Total Bilirubin 0.7 0.3 - 1.2 mg/dL   Bilirubin, Direct <8.4 0.0 - 0.2 mg/dL   Indirect Bilirubin NOT CALCULATED 0.3 - 0.9 mg/dL    Comment: Performed at [facility], [address], [place], [place] [address]  APTT     Status: None   Collection Time: 11/20/18  5:37 PM  Result Value Ref Range   aPTT 29 24 - 36 seconds    Comment: Performed at [facility], [address], [place], [place] [address]  Protime-INR     Status: None   Collection Time: 11/20/18  5:37 PM  Result Value Ref Range   Prothrombin Time 13.3 11.4 - 15.2 seconds   INR 1.0 0.8 - 1.2    Comment: (NOTE) INR goal varies based on device and disease states. Performed at [facility], [address] [place], [place] [address]   CBG monitoring, ED     Status: Abnormal   Collection Time: 11/20/18  5:37 PM  Result Value Ref Range   Glucose-Capillary 161 (H) 70 - 99 mg/dL   Comment 1 Notify RN    Comment 2 Document in Chart   SARS Coronavirus 2 (CEPHEID - Performed in [facility] hospital lab), Hosp Order     Status: None   Collection Time: 11/20/18  6:54 PM  Result Value Ref Range   SARS Coronavirus 2 NEGATIVE NEGATIVE    Comment: (NOTE) If result is NEGATIVE SARS-CoV-2 target nucleic acids are NOT DETECTED. The SARS-CoV-2 RNA is generally detectable in upper and lower  respiratory specimens during the acute phase of infection. The lowest  concentration of SARS-CoV-2 viral copies this assay  can detect is 250  copies / mL. A negative result does not preclude SARS-CoV-2 infection  and should not be used as the sole basis for treatment or other  patient management decisions.  A negative result may occur with  improper specimen collection / handling, submission of specimen other  than nasopharyngeal swab, presence of viral mutation(s) within the  areas targeted by this assay, and inadequate number of viral copies  (<250 copies / mL). A negative result must be combined with clinical  observations, patient history, and epidemiological information. If result is POSITIVE SARS-CoV-2 target nucleic acids are DETECTED. The SARS-CoV-2 RNA is generally detectable in upper and lower  respiratory specimens dur ing the acute phase of infection.  Positive  results are indicative of active infection with SARS-CoV-2.  Clinical  correlation with patient history and other diagnostic information is  necessary to determine patient infection status.  Positive results do  not rule out bacterial infection or co-infection with other viruses. If result is PRESUMPTIVE POSTIVE SARS-CoV-2 nucleic acids MAY BE PRESENT.   A presumptive positive result was obtained on the submitted specimen  and confirmed on repeat testing.  While 2019 novel coronavirus  (SARS-CoV-2) nucleic acids may be present in the submitted sample  additional confirmatory testing may be necessary for epidemiological  and / or clinical management purposes  to differentiate between  SARS-CoV-2 and other Sarbecovirus currently known to infect humans.  If clinically indicated additional testing with an alternate test  methodology [phone]) is advised. The SARS-CoV-2 RNA is generally  detectable in upper and lower respiratory sp ecimens during the acute  phase of infection. The expected result is Negative. Fact Sheet for Patients:  BoilerBrush.com.cy Fact Sheet for Healthcare  Providers: https://pope.com/ This test is not yet approved or cleared by the [place] FDA and has been authorized for detection and/or diagnosis of SARS-CoV-2 by FDA under an Emergency Use Authorization (EUA).  This EUA will remain in effect (meaning this test can be used) for the duration of the COVID-19 declaration under Section 564(b)(1) of the Act, 21 U.S.C. section 360bbb-3(b)(1), unless the authorization is terminated or revoked sooner. Performed at [facility], [address], [place], [place] [address]   Troponin I - ONCE - STAT     Status: Abnormal   Collection Time: 11/20/18  7:51 PM  Result Value Ref Range   Troponin I 0.30 (HH) <0.03 ng/mL    Comment: CRITICAL RESULT CALLED TO, READ BACK BY AND VERIFIED WITH: Mayford Knife D,RN 11/20/18 2104 WAYK Performed at [facility], [address] [place], [place] [address]    Dg Chest Portable 1 View  Result Date: 11/20/2018 CLINICAL DATA:  Chest pain. EXAM: PORTABLE CHEST 1 VIEW COMPARISON:  None. FINDINGS: The heart size and mediastinal contours are within normal limits. Both lungs are clear. The visualized skeletal structures are unremarkable. IMPRESSION: No active disease. Electronically Signed   By: Obie Dredge M.D.   On: 11/20/2018 18:12    Pending Labs Unresulted Labs (From admission, onward)    Start     Ordered   11/22/18 0500  Heparin level (unfractionated)  Daily,   R     11/20/18 2129   11/21/18 0500  CBC  Daily,   R     11/20/18 2129   11/21/18 0500  HIV antibody (Routine Testing)  Tomorrow morning,   R  11/20/18 2146   11/21/18 0500  Basic metabolic panel  Tomorrow morning,   R     11/20/18 2146   11/21/18 0430  Heparin level (unfractionated)  Once-Timed,   R     11/20/18 2129   11/20/18 2300  Troponin I - Now Then Q6H  Now then every 6 hours,   STAT     11/20/18 2146          Vitals/Pain Today's Vitals   11/20/18 2115 11/20/18 2149 11/20/18 2200 11/20/18  2215  BP: (!) 141/81 128/88 130/88 99/78  Pulse: 71 72 71 82  Resp: 18 17 (!) 8 (!) 22  SpO2: 97% 99% 96% 100%  Weight:      Height:      PainSc:        Isolation Precautions No active isolations  Medications Medications  heparin ADULT infusion 100 units/mL (25000 units/2550mL sodium chloride 0.45%) (1,200 Units/hr Intravenous New Bag/Given 11/20/18 2206)  aspirin EC tablet 81 mg (has no administration in time range)  amLODipine (NORVASC) tablet 5 mg (has no administration in time range)  losartan (COZAAR) tablet 50 mg (has no administration in time range)  pravastatin (PRAVACHOL) tablet 80 mg (has no administration in time range)  tamsulosin (FLOMAX) capsule 0.4 mg (has no administration in time range)  nitroGLYCERIN (NITROSTAT) SL tablet 0.4 mg (has no administration in time range)  acetaminophen (TYLENOL) tablet 650 mg (has no administration in time range)  ondansetron (ZOFRAN) injection 4 mg (has no administration in time range)  insulin aspart (novoLOG) injection 0-9 Units (has no administration in time range)  0.45 % NaCl with KCl 20 mEq / L infusion (has no administration in time range)  morphine 2 MG/ML injection 2-4 mg (has no administration in time range)  metoprolol succinate (TOPROL-XL) 24 hr tablet 25 mg (has no administration in time range)  lactated ringers bolus 500 mL (0 mLs Intravenous Stopped 11/20/18 1830)  heparin bolus via infusion 4,000 Units (4,000 Units Intravenous Bolus from Bag 11/20/18 2207)    Mobility walks Low fall risk   Focused Assessments   R Recommendations: See Admitting Provider Note  Report given to:   Additional Notes:

## 2018-11-20 NOTE — ED Triage Notes (Signed)
Pt here as a Stemi , pt was mowing grass and was on his way home and developed some nausea , went to fire station , b/p 65/0 , s b/p up to 155 on arrival , no chest pain , 324 mg asa given

## 2018-11-20 NOTE — ED Provider Notes (Signed)
[facility] Provider Note   CSN: [id number] Arrival date & time: 11/20/18  1726    History   Chief Complaint Chief Complaint  Patient presents with  . Code STEMI    HPI Colin Weaver is a 68 y.o. male.     The history is provided by the patient, the EMS personnel and medical records.  Chest Pain  Pain location:  L chest Pain quality: aching, pressure, throbbing and tightness   Pain radiates to:  L shoulder Pain severity:  Moderate Onset quality:  Sudden Duration:  1 hour Timing:  Constant Progression:  Unchanged Chronicity:  New Context: breathing, movement, at rest and stress   Relieved by:  Aspirin and nitroglycerin Ineffective treatments:  Rest, certain positions and leaning forward Associated symptoms: anxiety, diaphoresis, dizziness, nausea, near-syncope and shortness of breath   Associated symptoms: no back pain, no fever and no weakness   Risk factors: diabetes mellitus, high cholesterol, hypertension, male sex and obesity   Risk factors: no smoking     Past Medical History:  Diagnosis Date  . Diabetes mellitus without complication (HCC)   . Hypertension   . Renal disorder     Patient Active Problem List   Diagnosis Date Noted  . NSTEMI (non-ST elevated myocardial infarction) (HCC) 11/20/2018  . Hypertension 11/20/2018  . Diabetes mellitus type II, non insulin dependent (HCC) 11/20/2018  . Syncope 09/09/2015    Past Surgical History:  Procedure Laterality Date  . CYSTOSCOPY          Home Medications    Prior to Admission medications   Medication Sig Start Date End Date Taking? Authorizing Provider  amLODipine (NORVASC) 5 MG tablet Take 5 mg by mouth daily.   Yes [provider]  ANDROGEL PUMP 20.25 MG/ACT (1.62%) GEL Apply 1 application topically See admin instructions. Apply to each shoulder daily as directed 09/08/15  Yes [provider]  aspirin EC 81 MG tablet Take 81 mg by mouth  daily.   Yes [provider]  losartan (COZAAR) 50 MG tablet Take 50 mg by mouth every evening.    Yes [provider]  meloxicam (MOBIC) 15 MG tablet Take 15 mg by mouth daily as needed for pain.   Yes [provider]  metFORMIN (GLUCOPHAGE) 500 MG tablet Take 1,000 mg by mouth daily.  08/25/15  Yes [provider]  pravastatin (PRAVACHOL) 80 MG tablet Take 80 mg by mouth daily. 12/12/16  Yes [provider]  RAPAFLO 8 MG CAPS capsule Take 8 mg by mouth daily. 08/25/15  Yes [provider]  ondansetron (ZOFRAN ODT) 8 MG disintegrating tablet Take 1 tablet (8 mg total) by mouth every 8 (eight) hours as needed for nausea or vomiting. Patient not taking: Reported on 11/20/2018 08/22/13   Marisa Severintter, Olga, MD  oxyCODONE-acetaminophen (PERCOCET/ROXICET) 5-325 MG per tablet Take 2 tablets by mouth every 4 (four) hours as needed for severe pain. Patient not taking: Reported on 11/20/2018 08/22/13   Marisa Severintter, Olga, MD    Family History Family History  Problem Relation Age of Onset  . Diabetes Mother     Social History Social History   Tobacco Use  . Smoking status: Never Smoker  . Smokeless tobacco: Never Used  Substance Use Topics  . Alcohol use: No    Alcohol/week: 0.0 standard drinks  . Drug use: No     Allergies   Lisinopril   Review of Systems Review of Systems  Constitutional: Positive for  diaphoresis. Negative for fever.  Respiratory: Positive for shortness of breath.   Cardiovascular: Positive for chest pain and near-syncope.  Gastrointestinal: Positive for nausea.  Musculoskeletal: Negative for back pain.  Neurological: Positive for dizziness. Negative for weakness.  All other systems reviewed and are negative.    Physical Exam Updated Vital Signs BP 131/80   Pulse 72   Resp 15   Ht  (1.702 m)   Wt 111.1 kg   SpO2 98%   BMI 38.37 kg/m   Physical Exam Vitals signs and nursing note reviewed.  Constitutional:       Appearance: He is well-developed. He is ill-appearing and diaphoretic.  HENT:     Head: Normocephalic and atraumatic.     Nose: Nose normal.     Mouth/Throat:     Mouth: Mucous membranes are moist.  Eyes:     Conjunctiva/sclera: Conjunctivae normal.  Neck:     Musculoskeletal: Neck supple.  Cardiovascular:     Rate and Rhythm: Normal rate.  Pulmonary:     Comments: Patient tachypneic, respiratory rate 24 breaths/min, on supplemental nasal cannula, good O2 sat with waveform 98%, coarse breath sounds throughout Abdominal:     Palpations: Abdomen is soft.     Tenderness: There is no abdominal tenderness.  Musculoskeletal:     Comments: Trace bilateral lower extremity pitting edema  Skin:    General: Skin is warm.     Findings: No rash.  Neurological:     Mental Status: He is alert and oriented to person, place, and time. Mental status is at baseline.      ED Treatments / Results  Labs (all labs ordered are listed, but only abnormal results are displayed) Labs Reviewed  TROPONIN I - Abnormal; Notable for the following components:      Result Value   Troponin I 0.03 (*)    All other components within normal limits  CBC WITH DIFFERENTIAL/PLATELET - Abnormal; Notable for the following components:   WBC 10.6 (*)    All other components within normal limits  BASIC METABOLIC PANEL - Abnormal; Notable for the following components:   Glucose, Bld 192 (*)    Creatinine, Ser 1.58 (*)    GFR calc non Af Amer 45 (*)    GFR calc Af Amer 52 (*)    All other components within normal limits  TROPONIN I - Abnormal; Notable for the following components:   Troponin I 0.30 (*)    All other components within normal limits  CBG MONITORING, ED - Abnormal; Notable for the following components:   Glucose-Capillary 161 (*)    All other components within normal limits  SARS CORONAVIRUS 2 (HOSPITAL ORDER, PERFORMED IN [facility])  BRAIN NATRIURETIC PEPTIDE  HEPATIC FUNCTION PANEL   APTT  PROTIME-INR  HEPARIN LEVEL (UNFRACTIONATED)  CBC    EKG EKG Interpretation  Date/Time:  Friday November 20 2018 17:30:33 EDT Ventricular Rate:  95 PR Interval:    QRS Duration: 164 QT Interval:  411 QTC Calculation: 517 R Axis:   -44 Text Interpretation:  Sinus rhythm Left bundle branch block No significant change since last tracing Confirmed by Cathren Laine ([id number]) on 11/20/2018 5:34:40 PM   Radiology Dg Chest Portable 1 View  Result Date: 11/20/2018 CLINICAL DATA:  Chest pain. EXAM: PORTABLE CHEST 1 VIEW COMPARISON:  None. FINDINGS: The heart size and mediastinal contours are within normal limits. Both lungs are clear. The visualized skeletal structures are unremarkable. IMPRESSION: No active disease. Electronically Signed  By: Obie Dredge M.D.   On: 11/20/2018 18:12    Procedures Procedures (including critical care time)  Medications Ordered in ED Medications  heparin bolus via infusion 4,000 Units (has no administration in time range)  heparin ADULT infusion 100 units/mL (25000 units/250mL sodium chloride 0.45%) (has no administration in time range)  lactated ringers bolus 500 mL (0 mLs Intravenous Stopped 11/20/18 1830)     Initial Impression / Assessment and Plan / ED Course  I have reviewed the triage vital signs and the nursing notes.  Pertinent labs & imaging results that were available during my care of the patient were reviewed by me and considered in my medical decision making (see chart for details).        Medical Decision Making:  Colin Weaver is a 68 y.o. male who presented to the ED today with chest pain.  Past medical history significant for hypertension, diabetes type 2 Reviewed and confirmed nursing documentation for past medical history, family history, social history.  On my initial exam, the pt was diaphoretic, appears uncomfortable, not hypotensive, heart rate 95 bpm, afebrile, left bundle branch block on EKG, present on priors, acute  onset chest pain, resolved after aspirin and nitro with EMS, no active chest pain in ED.  The patient's risk factors for ACS were reviewed as well as the EKG. The CXR assists in r/o Pneumonia, Pneumothorax, Esophageal Tears.  No focal lung findings suggestive of pneumonia or pneumothorax. The patient does not appear to have a Pulmonary Embolism based on the Wells Score and there is no apparent asymmetric upper extremity or lower extremity edema/swelling suggestive of DVT.  Patient denies any fevers, change in chest pain with leaning forward or lying down making pericarditis, myocarditis less likely. There does not appear to be an Aortic Dissection either based on physical exam, historically pain not abrupt in onset, tearing or ripping, pulses symmetric. MSK strain vs Costochondritis are also of consideration however less likely given (-) Rooster crowing sign, pain not reproducible with palpation.  EKG (my interpretation):      NS rhythm with a rate of  95.      QRS 164. QTc 517.      LBBB present, no significant ST segment changes      No acute changes suggestive of hyperkalemia.      No WPW, or Brugada's Syndrome. When compared to previous ECGs from 08/26/13 change no new concerning changes found.  All radiology and laboratory studies reviewed independently and with my attending physician, agree with reading provided by radiologist unless otherwise noted.  Initial troponin 0.03, second troponin 0.3, given acute rise in delta troponin will treat as NSTEMI, start heparin bolus with drip, will admit for further evaluation and care. Upon reassessing patient, patient was calm, resting comfortably, explained results to patient, need for admission for further cardiac work-up.  Patient in agreement with disposition and plan. Based on the above findings, I believe patient requires admission. The above care was discussed with and agreed upon by my attending physician. Emergency Department Medication Summary:   Medications  heparin bolus via infusion 4,000 Units (has no administration in time range)  heparin ADULT infusion 100 units/mL (25000 units/253mL sodium chloride 0.45%) (has no administration in time range)  lactated ringers bolus 500 mL (0 mLs Intravenous Stopped 11/20/18 1830)         Erick Alley, MD 11/20/18 2137    Cathren Laine, MD 11/20/18 2241

## 2018-11-21 DIAGNOSIS — E661 Drug-induced obesity: Secondary | ICD-10-CM

## 2018-11-21 DIAGNOSIS — E785 Hyperlipidemia, unspecified: Secondary | ICD-10-CM

## 2018-11-21 DIAGNOSIS — Z6838 Body mass index (BMI) 38.0-38.9, adult: Secondary | ICD-10-CM

## 2018-11-21 LAB — GLUCOSE, CAPILLARY
Glucose-Capillary: 94 mg/dL (ref 70–99)
Glucose-Capillary: 103 mg/dL — ABNORMAL HIGH (ref 70–99)
Glucose-Capillary: 96 mg/dL (ref 70–99)
Glucose-Capillary: 114 mg/dL — ABNORMAL HIGH (ref 70–99)
Glucose-Capillary: 123 mg/dL — ABNORMAL HIGH (ref 70–99)

## 2018-11-21 LAB — HEPARIN LEVEL (UNFRACTIONATED)
Heparin Unfractionated: 0.26 IU/mL — ABNORMAL LOW (ref 0.30–0.70)
Heparin Unfractionated: 0.72 IU/mL — ABNORMAL HIGH (ref 0.30–0.70)

## 2018-11-21 LAB — TROPONIN I
Troponin I: 0.66 ng/mL (ref <0.03)
Troponin I: 0.94 ng/mL (ref <0.03)
Troponin I: 0.6 ng/mL (ref <0.03)

## 2018-11-21 LAB — BASIC METABOLIC PANEL
Anion gap: 11 (ref 5–15)
BUN: 12 mg/dL (ref 8–23)
CO2: 22 mmol/L (ref 22–32)
Calcium: 9 mg/dL (ref 8.9–10.3)
Chloride: 107 mmol/L (ref 98–111)
Creatinine, Ser: 1.06 mg/dL (ref 0.61–1.24)
GFR calc Af Amer: >60 mL/min (ref >60)
GFR calc non Af Amer: >60 mL/min (ref >60)
Glucose, Bld: 110 mg/dL — ABNORMAL HIGH (ref 70–99)
Potassium: 4 mmol/L (ref 3.5–5.1)
Sodium: 140 mmol/L (ref 135–145)

## 2018-11-21 LAB — CBC
HCT: 43.6 % (ref 39.0–52.0)
Hemoglobin: 14.5 g/dL (ref 13.0–17.0)
MCH: 29.1 pg (ref 26.0–34.0)
MCHC: 33.3 g/dL (ref 30.0–36.0)
MCV: 87.4 fL (ref 80.0–100.0)
Platelets: 247 10*3/uL (ref 150–400)
RBC: 4.99 MIL/uL (ref 4.22–5.81)
RDW: 12.8 % (ref 11.5–15.5)
WBC: 7.7 10*3/uL (ref 4.0–10.5)
nRBC: 0 % (ref 0.0–0.2)

## 2018-11-21 LAB — HEMOGLOBIN A1C
Hgb A1c MFr Bld: 7.6 % — ABNORMAL HIGH (ref 4.8–5.6)
Mean Plasma Glucose: 171.42 mg/dL

## 2018-11-21 MED ORDER — HEPARIN BOLUS VIA INFUSION
2000.0000 [IU] | Freq: Once | INTRAVENOUS | Status: AC
  Administered 2018-11-21: 2000 [IU] via INTRAVENOUS
  Filled 2018-11-21: qty 2000

## 2018-11-21 NOTE — Consult Note (Addendum)
CARDIOLOGY CONSULT NOTE  Patient ID: Colin ChaseMark E Deschene MRN: [id number] DOB/AGE: 1950/08/06 68 y.o.  Admit date: 11/20/2018 Referring Physician: [facility] Reason for Consultation:  Troponin elevation  HPI:   68 y.o. African American male  with hypertension, hyperlipidemia, type 2 DM, BPH. Patient is a Education officer, environmentalpastor, fairly active at baseline-walks 4 miles most days of the week without any chest pain or shortness of breath. On 11/19/2018, he experienced palpitations while driving. He denies feeling any chest pain or shortness of breath. He was admitted to the hospitalist service. Workup showed trop elevation peak at 0.94, now down to 0.60. EKG shows LBBB, not new. Currently, he denies feeling any palpitations, chest pain, or shortness of breath.   Past Medical History:  Diagnosis Date  . Diabetes mellitus without complication (HCC)   . Hypertension   . Renal disorder      Past Surgical History:  Procedure Laterality Date  . CYSTOSCOPY       Family History  Problem Relation Age of Onset  . Diabetes Mother      Social History: Social History   Socioeconomic History  . Marital status: Married    Spouse name: Not on file  . Number of children: Not on file  . Years of education: Not on file  . Highest education level: Not on file  Occupational History  . Not on file  Social Needs  . Financial resource strain: Not on file  . Food insecurity:    Worry: Not on file    Inability: Not on file  . Transportation needs:    Medical: Not on file    Non-medical: Not on file  Tobacco Use  . Smoking status: Never Smoker  . Smokeless tobacco: Never Used  Substance and Sexual Activity  . Alcohol use: No    Alcohol/week: 0.0 standard drinks  . Drug use: No  . Sexual activity: Not on file  Lifestyle  . Physical activity:    Days per week: Not on file    Minutes per session: Not on file  . Stress: Not on file  Relationships  . Social connections:    Talks on phone: Not on file     Gets together: Not on file    Attends religious service: Not on file    Active member of club or organization: Not on file    Attends meetings of clubs or organizations: Not on file    Relationship status: Not on file  . Intimate partner violence:    Fear of current or ex partner: Not on file    Emotionally abused: Not on file    Physically abused: Not on file    Forced sexual activity: Not on file  Other Topics Concern  . Not on file  Social History Narrative  . Not on file     Medications Prior to Admission  Medication Sig Dispense Refill Last Dose  . amLODipine (NORVASC) 5 MG tablet Take 5 mg by mouth daily.   11/20/2018 at Unknown time  . ANDROGEL PUMP 20.25 MG/ACT (1.62%) GEL Apply 1 application topically See admin instructions. Apply to each shoulder daily as directed   11/20/2018 at am  . aspirin EC 81 MG tablet Take 81 mg by mouth daily.   11/20/2018 at 1700  . losartan (COZAAR) 50 MG tablet Take 50 mg by mouth every evening.    11/19/2018 at pm  . meloxicam (MOBIC) 15 MG tablet Take 15 mg by mouth daily as needed for pain.  11/06/2018 at Unknown time  . metFORMIN (GLUCOPHAGE) 500 MG tablet Take 1,000 mg by mouth daily.    11/20/2018 at 1600  . pravastatin (PRAVACHOL) 80 MG tablet Take 80 mg by mouth daily.   11/20/2018 at 1600  . RAPAFLO 8 MG CAPS capsule Take 8 mg by mouth daily.   11/19/2018 at Unknown time    Review of Systems  Constitution: Negative for decreased appetite, malaise/fatigue, weight gain and weight loss.  HENT: Negative for congestion.   Eyes: Negative for visual disturbance.  Cardiovascular: Positive for palpitations (Now resolved). Negative for chest pain, dyspnea on exertion, leg swelling and syncope.  Respiratory: Negative for cough.   Endocrine: Negative for cold intolerance.  Hematologic/Lymphatic: Does not bruise/bleed easily.  Skin: Negative for itching and rash.  Musculoskeletal: Negative for myalgias.  Gastrointestinal: Negative for abdominal pain, nausea  and vomiting.  Genitourinary: Negative for dysuria.  Neurological: Negative for dizziness and weakness.  Psychiatric/Behavioral: The patient is not nervous/anxious.   All other systems reviewed and are negative.     Physical Exam: Physical Exam  Constitutional: He is oriented to person, place, and time. He appears well-developed and well-nourished. No distress.  HENT:  Head: Normocephalic and atraumatic.  Eyes: Pupils are equal, round, and reactive to light. Conjunctivae are normal.  Neck: No JVD present.  Cardiovascular: Normal rate, regular rhythm and intact distal pulses.  Pulmonary/Chest: Effort normal and breath sounds normal. He has no wheezes. He has no rales.  Abdominal: Soft. Bowel sounds are normal. There is no rebound.  Musculoskeletal:        General: No edema.  Lymphadenopathy:    He has no cervical adenopathy.  Neurological: He is alert and oriented to person, place, and time. No cranial nerve deficit.  Skin: Skin is warm and dry.  Psychiatric: He has a normal mood and affect.  Nursing note and vitals reviewed.    Labs:   Lab Results  Component Value Date   WBC 7.7 11/21/2018   HGB 14.5 11/21/2018   HCT 43.6 11/21/2018   MCV 87.4 11/21/2018   PLT 247 11/21/2018    Recent Labs  Lab 11/20/18 1737 11/21/18 0405  NA 143 140  K 3.9 4.0  CL 108 107  CO2 24 22  BUN 13 12  CREATININE 1.58* 1.06  CALCIUM 9.7 9.0  PROT 7.6  --   BILITOT 0.7  --   ALKPHOS 46  --   ALT 29  --   AST 21  --   GLUCOSE 192* 110*    Lipid Panel  No results found for: CHOL, TRIG, HDL, CHOLHDL, VLDL, LDLCALC  BNP (last 3 results) Recent Labs    11/20/18 1737  BNP 28.2    HEMOGLOBIN A1C Lab Results  Component Value Date   HGBA1C 7.6 (H) 11/21/2018   MPG 171.42 11/21/2018    Cardiac Panel (last 3 results) Recent Labs    11/20/18 2241 11/21/18 0405 11/21/18 1058  TROPONINI 0.66* 0.94* 0.60*    Lab Results  Component Value Date   TROPONINI 0.60 (HH)  11/21/2018     TSH No results for input(s): TSH in the last 8760 hours.    Radiology: Dg Chest Portable 1 View  Result Date: 11/20/2018 CLINICAL DATA:  Chest pain. EXAM: PORTABLE CHEST 1 VIEW COMPARISON:  None. FINDINGS: The heart size and mediastinal contours are within normal limits. Both lungs are clear. The visualized skeletal structures are unremarkable. IMPRESSION: No active disease. Electronically Signed   By: Obie DredgeWilliam T Derry  M.D.   On: 11/20/2018 18:12    Scheduled Meds: . amLODipine  5 mg Oral Daily  . aspirin EC  81 mg Oral Daily  . insulin aspart  0-9 Units Subcutaneous Q4H  . losartan  50 mg Oral QPM  . metoprolol succinate  25 mg Oral Daily  . pravastatin  80 mg Oral q1800  . tamsulosin  0.4 mg Oral QPC breakfast   Continuous Infusions: . heparin 1,400 Units/hr (11/21/18 1636)   PRN Meds:.acetaminophen, morphine injection, nitroGLYCERIN, ondansetron (ZOFRAN) IV  CARDIAC STUDIES:  EKG 11/21/2018: Sinus rhythm, first degree AV block. LBBB  Echocardiogram 2017: - Left ventricle: Systolic function was mildly reduced. The   estimated ejection fraction was in the range of 45% to 50%.   Akinesis of the apical myocardium. Doppler parameters are   consistent with abnormal left ventricular relaxation (grade 1   diastolic dysfunction). - Aortic valve: There was trivial regurgitation. - Left atrium: The atrium was mildly dilated.    Assessment & Recommendations:  68 y.o. African American male  with hypertension, hyperlipidemia, type 2 DM, BPH, now admitted with NSTEMI.  NSTEMI: Chest pain free. Plan for coronary angiography on Monday or Tuesday.  Continue Aspirin, heparin.  Continue metoprolol succinate 25 mg, amlodipine 5 mg. Hold losartan on Sunday evening, anticipating coronary angiography on Monday Switch pravastatin to crestor 40 mg. Check lipid panel. Check echocardiogram  Palpitations: Possible angina equivalent. No arrhthymias thus far. May need to  consider event monitor outpatient.   Type 2 DM: Hold metformin while in the hospital, anticipating coronary angiography.   Nigel Mormon, MD 11/21/2018, 6:14 PM [facility] Pager: [phone] Office: [phone] If no answer Cell [phone]

## 2018-11-21 NOTE — Progress Notes (Signed)
ANTICOAGULATION CONSULT NOTE - Follow Up Consult  Pharmacy Consult for Heparin Indication: chest pain/ACS  Allergies  Allergen Reactions  . Lisinopril Cough    Allergy or intolerance (??)    Patient Measurements: Height: 5\' 7"  (170.2 cm) Weight: 248 lb 11.2 oz (112.8 kg) IBW/kg (Calculated) : 66.1 Heparin Dosing Weight: 95 kg Vital Signs: Temp: 98 F (36.7 C) (06/06 0439) Temp Source: Oral (06/06 0439) BP: 129/73 (06/06 0439) Pulse Rate: 57 (06/06 0439)  Labs: Recent Labs    11/20/18 1737 11/20/18 1951 11/20/18 2241 11/21/18 0405  HGB 15.8  --   --  14.5  HCT 48.3  --   --  43.6  PLT 275  --   --  247  APTT 29  --   --   --   LABPROT 13.3  --   --   --   INR 1.0  --   --   --   HEPARINUNFRC  --   --   --  0.26*  CREATININE 1.58*  --   --   --   TROPONINI 0.03* 0.30* 0.66*  --     Estimated Creatinine Clearance: 54.4 mL/min (A) (by C-G formula based on SCr of 1.58 mg/dL (H)).  Assessment: 68 y.o. male with chest pain for heparin  Goal of Therapy:  Heparin level 0.3-0.7 units/ml Monitor platelets by anticoagulation protocol: Yes   Plan:  Heparin 2000 units IV bolus, then increase heparin  1450 units/hr Check heparin level in 6 hours.   Caryl Pina 11/21/2018,6:19 AM

## 2018-11-21 NOTE — Progress Notes (Signed)
ANTICOAGULATION CONSULT NOTE  Pharmacy Consult for heparin Indication: chest pain/ACS  Heparin Dosing Weight: 91.2 kg  Labs: Recent Labs    11/20/18 1737  11/20/18 2241 11/21/18 0405 11/21/18 1058 11/21/18 1441  HGB 15.8  --   --  14.5  --   --   HCT 48.3  --   --  43.6  --   --   PLT 275  --   --  247  --   --   APTT 29  --   --   --   --   --   LABPROT 13.3  --   --   --   --   --   INR 1.0  --   --   --   --   --   HEPARINUNFRC  --   --   --  0.26*  --  0.72*  CREATININE 1.58*  --   --  1.06  --   --   TROPONINI 0.03*   < > 0.66* 0.94* 0.60*  --    < > = values in this interval not displayed.    Assessment: Colin Weaver presenting with CP. Pharmacy consulted to dose heparin for ACS. Not on anticoagulation PTA. Heparin level now slightly above goal at 0.72 following re-bolus and rate increase this AM. No issues with infusion and running at correct rate confirmed with RN. CBC WNL, no bleeding noted.  Goal of Therapy:  Heparin level 0.3-0.7 units/ml Monitor platelets by anticoagulation protocol: Yes   Plan:  Decrease heparin to 1400 units/hr Recheck heparin level with AM labs Daily heparin level and CBC Monitor for s/sx of bleeding  Nealy Karapetian N. Gerarda Fraction, PharmD, [place] PGY2 Infectious Diseases Pharmacy Resident Phone: [phone] 11/21/2018 3:28 PM

## 2018-11-21 NOTE — Progress Notes (Signed)
PROGRESS NOTE    Colin Weaver  JXB:[id number] DOB: 1951-01-24 DOA: 11/20/2018 PCP: Darci NeedleKohut, Walter, MD     Brief Narrative:  68 y.o. male with medical history significant for hypertension, type 2 diabetes mellitus, and mild chronic renal insufficiency, now presenting to the emergency department after an acute episode of chest discomfort, diaphoresis, and lightheadedness.  Patient reports that he woke in his usual state of health, was out doing yard work, and then developed an intense discomfort in his chest that he has difficulty describing, lightheadedness, and was reportedly diaphoretic.  Patient laid down at home, took his evening medications, but his symptoms persisted and EMS was called.  Patient was reportedly hypotensive with EMS, had LBBB on EKG, and was brought in as code STEMI.  He was given full dose aspirin prior to arrival.  ED Course: Upon arrival to the ED, patient is found to be saturating well on room air, normal heart rate and blood pressure, no respiratory distress, but appeared uncomfortable and diaphoretic per ED physician report.  EKG features a sinus rhythm with LBBB.  Chest x-ray is negative for acute cardiopulmonary disease.  Chemistry panel is notable for glucose 192 and creatinine 1.58.    Assessment & Plan: 1-NSTEMI (non-ST elevated myocardial infarction) (HCC) -Heart score of 5 -Currently denying chest pain or shortness of breath. -Troponin peaked at 0.94 -Continue heparin drip, aspirin, beta-blocker and statins. -Cardiology has been consulted as patient will most likely require a heart cath; follow recommendations. -Heart healthy diet for now with intention for n.p.o. status except for medication after midnight.  2-Hypertension -Stable overall -Continue metoprolol, losartan and Norvasc -Follow vital signs and adjust antihypertensive regimen as needed.  3-Diabetes mellitus type II, non insulin dependent (HCC) with nephropathy -A1c 7.6 -Hold oral hypoglycemic  agents while inpatient -Uses sliding scale insulin CBGs fluctuation.  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 -Will continue to monitor renal function and minimize nephrotoxic agents.  5-obesity -Body mass index is 38.95 kg/m. -Low calorie diet, portion control and increase physical activity has been discussed with patient  6-hyperlipidemia -Continue statins -Check lipid panel.  7-BPH -Continue Flomax -Patient denies any symptoms of urinary retention.  DVT prophylaxis: Receiving heparin drip. Code Status: Full code Family Communication: No family at bedside Disposition Plan: Remains inpatient, continue telemetry monitoring, continue heparin drip, advance diet as now cath intervention anticipated at this moment.  Follow cardiology recommendations.  Consultants:   Cardiology service (Dr. Truett MainlandManish Patwardhan)  Procedures:   See below for x-ray reports  Antimicrobials:  Anti-infectives (From admission, onward)   None       Subjective: Chest pain free, afebrile, no nausea, no vomiting, reports no palpitations.  Patient expressed no shortness of breath.  Objective: Vitals:   11/21/18 0435 11/21/18 0439 11/21/18 0825 11/21/18 1221  BP:  129/73 136/75 131/75  Pulse:  (!) 57 (!) 54 (!) 58  Resp:  20 20 20   Temp:  98 F (36.7 C) 98.2 F (36.8 C) 98 F (36.7 C)  TempSrc:  Oral Oral Oral  SpO2:  100% 100% 97%  Weight: 112.8 kg     Height:        Intake/Output Summary (Last 24 hours) at 11/21/2018 1531 Last data filed at 11/21/2018 1441 Gross per  24 hour  Intake 480 ml  Output 900 ml  Net -420 ml   Filed Weights   11/20/18 2100 11/20/18 2311 11/21/18 0435  Weight: 111.1 kg 112.7 kg 112.8 kg     Examination: General exam: Alert, awake, oriented x 3; currently denying chest pain, shortness of breath, nausea or vomiting.  Patient also denies palpitation Respiratory system: Clear to auscultation. Respiratory effort normal. Cardiovascular system:RRR. No murmurs, rubs, gallops.  No JVD on exam. Gastrointestinal system: Abdomen is obese, nondistended, soft and nontender. No organomegaly or masses felt. Normal bowel sounds heard. Central nervous system: Alert and oriented. No focal neurological deficits. Extremities: No C/C/E, +pedal pulses Skin: No rashes, lesions or ulcers Psychiatry: Judgement and insight appear normal. Mood & affect appropriate.   Data Reviewed: I have personally reviewed following labs and imaging studies  CBC: Recent Labs  Lab 11/20/18 1737 11/21/18 0405  WBC 10.6* 7.7  NEUTROABS 5.7  --   HGB 15.8 14.5  HCT 48.3 43.6  MCV 88.6 87.4  PLT 275 247   Basic Metabolic Panel: Recent Labs  Lab 11/20/18 1737 11/21/18 0405  NA 143 140  K 3.9 4.0  CL 108 107  CO2 24 22  GLUCOSE 192* 110*  BUN 13 12  CREATININE 1.58* 1.06  CALCIUM 9.7 9.0   GFR: Estimated Creatinine Clearance: 81.1 mL/min (by C-G formula based on SCr of 1.06 mg/dL).   Liver Function Tests: Recent Labs  Lab 11/20/18 1737  AST 21  ALT 29  ALKPHOS 46  BILITOT 0.7  PROT 7.6  ALBUMIN 4.0   Coagulation Profile: Recent Labs  Lab 11/20/18 1737  INR 1.0   Cardiac Enzymes: Recent Labs  Lab 11/20/18 1737 11/20/18 1951 11/20/18 2241 11/21/18 0405 11/21/18 1058  TROPONINI 0.03* 0.30* 0.66* 0.94* 0.60*   HbA1C: Recent Labs    11/21/18 0812  HGBA1C 7.6*   CBG: Recent Labs  Lab 11/20/18 1737 11/20/18 2321 11/21/18 0445 11/21/18 0845 11/21/18 1214  GLUCAP 161* 122* 94 103* 96   Urine analysis:    Component Value Date/Time   COLORURINE YELLOW (A) 09/09/2015 1450   APPEARANCEUR HAZY (A) 09/09/2015 1450   LABSPEC 1.027 09/09/2015 1450   PHURINE 5.0 09/09/2015 1450    GLUCOSEU 150 (A) 09/09/2015 1450   HGBUR NEGATIVE 09/09/2015 1450   BILIRUBINUR NEGATIVE 09/09/2015 1450   KETONESUR NEGATIVE 09/09/2015 1450   PROTEINUR 30 (A) 09/09/2015 1450   UROBILINOGEN 0.2 08/22/2013 0150   NITRITE NEGATIVE 09/09/2015 1450   LEUKOCYTESUR NEGATIVE 09/09/2015 1450    Recent Results (from the past 240 hour(s))  SARS Coronavirus 2 (CEPHEID - Performed in [facility]), Hosp Order     Status: None   Collection Time: 11/20/18  6:54 PM  Result Value Ref Range Status   SARS Coronavirus 2 NEGATIVE NEGATIVE Final    Comment: (NOTE) If result is NEGATIVE SARS-CoV-2 target nucleic acids are NOT DETECTED. The SARS-CoV-2 RNA is generally detectable in upper and lower  respiratory specimens during the acute phase of infection. The lowest  concentration of SARS-CoV-2 viral copies this assay can detect is 250  copies / mL. A negative result does not preclude SARS-CoV-2 infection  and should not be used as the sole basis for treatment or other  patient management decisions.  A negative result may occur with  improper specimen collection / handling, submission of specimen other  than nasopharyngeal swab, presence of viral mutation(s) within the  areas targeted by this assay, and inadequate number of viral  copies  (<250 copies / mL). A negative result must be combined with clinical  observations, patient history, and epidemiological information. If result is POSITIVE SARS-CoV-2 target nucleic acids are DETECTED. The SARS-CoV-2 RNA is generally detectable in upper and lower  respiratory specimens dur ing the acute phase of infection.  Positive  results are indicative of active infection with SARS-CoV-2.  Clinical  correlation with patient history and other diagnostic information is  necessary to determine patient infection status.  Positive results do  not rule out bacterial infection or co-infection with other viruses. If result is PRESUMPTIVE POSTIVE  SARS-CoV-2 nucleic acids MAY BE PRESENT.   A presumptive positive result was obtained on the submitted specimen  and confirmed on repeat testing.  While 2019 novel coronavirus  (SARS-CoV-2) nucleic acids may be present in the submitted sample  additional confirmatory testing may be necessary for epidemiological  and / or clinical management purposes  to differentiate between  SARS-CoV-2 and other Sarbecovirus currently known to infect humans.  If clinically indicated additional testing with an alternate test  methodology [phone]) is advised. The SARS-CoV-2 RNA is generally  detectable in upper and lower respiratory sp ecimens during the acute  phase of infection. The expected result is Negative. Fact Sheet for Patients:  StrictlyIdeas.no Fact Sheet for Healthcare Providers: BankingDealers.co.za This test is not yet approved or cleared by the [place] FDA and has been authorized for detection and/or diagnosis of SARS-CoV-2 by FDA under an Emergency Use Authorization (EUA).  This EUA will remain in effect (meaning this test can be used) for the duration of the COVID-19 declaration under Section 564(b)(1) of the Act, 21 U.S.C. section 360bbb-3(b)(1), unless the authorization is terminated or revoked sooner. Performed at [facility], [place] [address] [place], [place] [address]      Radiology Studies: Dg Chest Portable 1 View  Result Date: 11/20/2018 CLINICAL DATA:  Chest pain. EXAM: PORTABLE CHEST 1 VIEW COMPARISON:  None. FINDINGS: The heart size and mediastinal contours are within normal limits. Both lungs are clear. The visualized skeletal structures are unremarkable. IMPRESSION: No active disease. Electronically Signed   By: Titus Dubin M.D.   On: 11/20/2018 18:12    Scheduled Meds: . amLODipine  5 mg Oral Daily  . aspirin EC  81 mg Oral Daily  . insulin aspart  0-9 Units Subcutaneous Q4H  . losartan  50 mg Oral QPM   . metoprolol succinate  25 mg Oral Daily  . pravastatin  80 mg Oral q1800  . tamsulosin  0.4 mg Oral QPC breakfast   Continuous Infusions: . heparin 1,450 Units/hr (11/21/18 1226)     LOS: 1 day    Time spent: 35 minutes. Greater than 50% of this time was spent in direct contact with the patient, coordinating care and discussing relevant ongoing clinical issues, including high heart score, abnormal troponin and concerns for angina equivalent.  Patient receiving active treatment for NSTEMI; cardiology consulted and will follow rec's.   Barton Dubois, MD [facility] Pager [phone]  11/21/2018, 3:31 PM

## 2018-11-22 ENCOUNTER — Inpatient Hospital Stay (HOSPITAL_COMMUNITY): Payer: 59

## 2018-11-22 DIAGNOSIS — I214 Non-ST elevation (NSTEMI) myocardial infarction: Secondary | ICD-10-CM

## 2018-11-22 LAB — CBC
HCT: 43.6 % (ref 39.0–52.0)
Hemoglobin: 14.4 g/dL (ref 13.0–17.0)
MCH: 28.7 pg (ref 26.0–34.0)
MCHC: 33 g/dL (ref 30.0–36.0)
MCV: 86.9 fL (ref 80.0–100.0)
Platelets: 237 10*3/uL (ref 150–400)
RBC: 5.02 MIL/uL (ref 4.22–5.81)
RDW: 12.9 % (ref 11.5–15.5)
WBC: 7.4 10*3/uL (ref 4.0–10.5)
nRBC: 0 % (ref 0.0–0.2)

## 2018-11-22 LAB — LIPID PANEL
Cholesterol: 103 mg/dL (ref 0–200)
HDL: 34 mg/dL — ABNORMAL LOW (ref >40)
LDL Cholesterol: 58 mg/dL (ref 0–99)
Total CHOL/HDL Ratio: 3 RATIO
Triglycerides: 55 mg/dL (ref <150)
VLDL: 11 mg/dL (ref 0–40)

## 2018-11-22 LAB — ECHOCARDIOGRAM COMPLETE
Height: 67 in
Weight: 3964.8 oz

## 2018-11-22 LAB — GLUCOSE, CAPILLARY
Glucose-Capillary: 101 mg/dL — ABNORMAL HIGH (ref 70–99)
Glucose-Capillary: 101 mg/dL — ABNORMAL HIGH (ref 70–99)
Glucose-Capillary: 107 mg/dL — ABNORMAL HIGH (ref 70–99)
Glucose-Capillary: 115 mg/dL — ABNORMAL HIGH (ref 70–99)
Glucose-Capillary: 164 mg/dL — ABNORMAL HIGH (ref 70–99)
Glucose-Capillary: 169 mg/dL — ABNORMAL HIGH (ref 70–99)

## 2018-11-22 LAB — HIV ANTIBODY (ROUTINE TESTING W REFLEX): HIV Screen 4th Generation wRfx: NONREACTIVE

## 2018-11-22 LAB — HEPARIN LEVEL (UNFRACTIONATED): Heparin Unfractionated: 0.56 IU/mL (ref 0.30–0.70)

## 2018-11-22 MED ORDER — PERFLUTREN LIPID MICROSPHERE
1.0000 mL | INTRAVENOUS | Status: AC | PRN
  Administered 2018-11-22: 2 mL via INTRAVENOUS
  Filled 2018-11-22: qty 10

## 2018-11-22 MED ORDER — LOSARTAN POTASSIUM 50 MG PO TABS: 50.0000 mg | ORAL_TABLET | Freq: Every evening | ORAL | Status: AC

## 2018-11-22 MED ORDER — SODIUM CHLORIDE 0.9 % IV SOLN
INTRAVENOUS | Status: AC
  Administered 2018-11-22: 11:00:00 via INTRAVENOUS

## 2018-11-22 NOTE — Progress Notes (Signed)
ANTICOAGULATION CONSULT NOTE  Pharmacy Consult for heparin Indication: chest pain/ACS  Heparin Dosing Weight: 91.2 kg  Labs: Recent Labs    11/20/18 1737  11/20/18 2241 11/21/18 0405 11/21/18 1058 11/21/18 1441 11/22/18 0504  HGB 15.8  --   --  14.5  --   --  14.4  HCT 48.3  --   --  43.6  --   --  43.6  PLT 275  --   --  247  --   --  237  APTT 29  --   --   --   --   --   --   LABPROT 13.3  --   --   --   --   --   --   INR 1.0  --   --   --   --   --   --   HEPARINUNFRC  --   --   --  0.26*  --  0.72* 0.56  CREATININE 1.58*  --   --  1.06  --   --   --   TROPONINI 0.03*   < > 0.66* 0.94* 0.60*  --   --    < > = values in this interval not displayed.    Assessment: 79 yoM presenting with CP. Pharmacy consulted to dose heparin for ACS. Not on anticoagulation PTA.  Heparin level within goal at 0.56. CBC is stable. No infusion issues or bleeding noted.  Goal of Therapy:  Heparin level 0.3-0.7 units/ml Monitor platelets by anticoagulation protocol: Yes   Plan:  Continue heparin at 1400 units/hr Daily heparin level and CBC Monitor for s/sx of bleeding  Jackson Latino, PharmD PGY1 Pharmacy Resident Phone [phone] 11/22/2018     9:37 AM

## 2018-11-22 NOTE — Progress Notes (Signed)
  Echocardiogram 2D Echocardiogram has been performed.  Luccia Reinheimer L Androw 11/22/2018, 1:23 PM

## 2018-11-22 NOTE — Progress Notes (Addendum)
PROGRESS NOTE    Colin Weaver  VHQ:[id number] DOB: 1950-12-23 DOA: 11/20/2018 PCP: Anda Kraft, MD     Brief Narrative:  68 y.o. male with medical history significant for hypertension, type 2 diabetes mellitus, and mild chronic renal insufficiency, now presenting to the emergency department after an acute episode of chest discomfort, diaphoresis, and lightheadedness.  Patient reports that he woke in his usual state of health, was out doing yard work, and then developed an intense discomfort in his chest that he has difficulty describing, lightheadedness, and was reportedly diaphoretic.  Patient laid down at home, took his evening medications, but his symptoms persisted and EMS was called.  Patient was reportedly hypotensive with EMS, had LBBB on EKG, and was brought in as code STEMI.  He was given full dose aspirin prior to arrival.  ED Course: Upon arrival to the ED, patient is found to be saturating well on room air, normal heart rate and blood pressure, no respiratory distress, but appeared uncomfortable and diaphoretic per ED physician report.  EKG features a sinus rhythm with LBBB.  Chest x-ray is negative for acute cardiopulmonary disease.  Chemistry panel is notable for glucose 192 and creatinine 1.58.   Assessment & Plan: 1-NSTEMI (non-ST elevated myocardial infarction) (HCC) -Heart score of 5 -Currently denying chest pain or shortness of breath. -Troponin peaked at 0.94 -Continue heparin drip, aspirin, beta-blocker and statins. -Cardiology has been consulted and plan is for heart cath in am. -Heart healthy diet for now with intention for n.p.o. status except for medication after midnight.  2-Hypertension -Stable overall -Continue metoprolol, losartan and Norvasc -Following cardiology rec's will hold losartan after today's dose -gentle IVF's in anticipation for heart cath in am.  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 -Will continue to monitor renal function and minimize nephrotoxic agents.  5-obesity -Body mass index is 38.81 kg/m. -Low calorie diet, portion control and increase physical activity has been discussed with patient  6-hyperlipidemia -Continue statins -Check lipid panel.  7-BPH -Continue Flomax -Patient denies any symptoms of urinary retention.  DVT prophylaxis: Receiving heparin drip. Code Status: Full code Family Communication: No family at bedside Disposition Plan: Remains inpatient, continue telemetry monitoring, continue heparin drip. Following cardiology recommendations patient for heart cath on 11/23/18.  Consultants:   Cardiology service (Dr. Vernell Leep)  Procedures:   See below for x-ray reports  Antimicrobials:  Anti-infectives (From admission, onward)   None      Subjective: Afebrile, chest pain, no nausea, no vomiting, no SOB or diaphoresis. Patient reported no palpitations  Objective: Vitals:   11/22/18 0021 11/22/18 0405 11/22/18 0757 11/22/18 1201  BP: 140/87 (!) 141/77 (!) 149/81 121/70  Pulse: (!) 54 (!) 54 (!) 53 (!) 58  Resp: 18 18 20 20   Temp: 97.6 F (36.4 C) (!) 97.2 F (36.2 C)  98.4 F (36.9 C)  TempSrc: Oral   Oral  SpO2: 100% 99% 100% 100%  Weight:  112.4 kg    Height:        Intake/Output Summary (Last 24 hours) at 11/22/2018 1334 Last data filed at 11/22/2018 1300 Gross per  24 hour  Intake 1314.49 ml  Output 2550 ml  Net -1235.51 ml   Filed Weights   11/20/18 2311 11/21/18 0435 11/22/18 0405  Weight: 112.7 kg 112.8 kg 112.4 kg     Examination: General exam: Alert, awake, oriented x 3, no CP, no SOB, in noa cute distress.  Respiratory system: Clear to auscultation. Respiratory effort normal. Cardiovascular system:RRR. No murmurs, rubs, gallops. Gastrointestinal system: Abdomen is nondistended, soft and nontender. No organomegaly or masses felt. Normal bowel sounds heard. Central nervous system: Alert and oriented. No focal neurological deficits. Extremities: No C/C/E, +pedal pulses Skin: No rashes, lesions or ulcers Psychiatry: Judgement and insight appear normal. Mood & affect appropriate.   Data Reviewed: I have personally reviewed following labs and imaging studies  CBC: Recent Labs  Lab 11/20/18 1737 11/21/18 0405 11/22/18 0504  WBC 10.6* 7.7 7.4  NEUTROABS 5.7  --   --   HGB 15.8 14.5 14.4  HCT 48.3 43.6 43.6  MCV 88.6 87.4 86.9  PLT 275 247 237   Basic Metabolic Panel: Recent Labs  Lab 11/20/18 1737 11/21/18 0405  NA 143 140  K 3.9 4.0  CL 108 107  CO2 24 22  GLUCOSE 192* 110*  BUN 13 12  CREATININE 1.58* 1.06  CALCIUM 9.7 9.0   GFR: Estimated Creatinine Clearance: 80.9 mL/min (by C-G formula based on SCr of 1.06 mg/dL).   Liver Function Tests: Recent Labs  Lab 11/20/18 1737  AST 21  ALT 29  ALKPHOS 46  BILITOT 0.7  PROT 7.6  ALBUMIN 4.0   Coagulation Profile: Recent Labs  Lab 11/20/18 1737  INR 1.0   Cardiac Enzymes: Recent Labs  Lab 11/20/18 1737 11/20/18 1951 11/20/18 2241 11/21/18 0405 11/21/18 1058  TROPONINI 0.03* 0.30* 0.66* 0.94* 0.60*   HbA1C: Recent Labs    11/21/18 0812  HGBA1C 7.6*   CBG: Recent Labs  Lab 11/21/18 2114 11/22/18 0019 11/22/18 0402 11/22/18 0753 11/22/18 1159  GLUCAP 123* 101* 101* 107* 164*   Urine analysis:    Component Value Date/Time   COLORURINE YELLOW (A) 09/09/2015 1450   APPEARANCEUR HAZY (A) 09/09/2015 1450   LABSPEC 1.027 09/09/2015 1450   PHURINE 5.0 09/09/2015 1450   GLUCOSEU 150 (A) 09/09/2015 1450    HGBUR NEGATIVE 09/09/2015 1450   BILIRUBINUR NEGATIVE 09/09/2015 1450   KETONESUR NEGATIVE 09/09/2015 1450   PROTEINUR 30 (A) 09/09/2015 1450   UROBILINOGEN 0.2 08/22/2013 0150   NITRITE NEGATIVE 09/09/2015 1450   LEUKOCYTESUR NEGATIVE 09/09/2015 1450    Recent Results (from the past 240 hour(s))  SARS Coronavirus 2 (CEPHEID - Performed in [facility] hospital lab), Hosp Order     Status: None   Collection Time: 11/20/18  6:54 PM  Result Value Ref Range Status   SARS Coronavirus 2 NEGATIVE NEGATIVE Final    Comment: (NOTE) If result is NEGATIVE SARS-CoV-2 target nucleic acids are NOT DETECTED. The SARS-CoV-2 RNA is generally detectable in upper and lower  respiratory specimens during the acute phase of infection. The lowest  concentration of SARS-CoV-2 viral copies this assay can detect is 250  copies / mL. A negative result does not preclude SARS-CoV-2 infection  and should not be used as the sole basis for treatment or other  patient management decisions.  A negative result may occur with  improper specimen collection / handling, submission of specimen other  than nasopharyngeal swab, presence of viral mutation(s) within the  areas targeted by this assay, and inadequate number of viral copies  (<  250 copies / mL). A negative result must be combined with clinical  observations, patient history, and epidemiological information. If result is POSITIVE SARS-CoV-2 target nucleic acids are DETECTED. The SARS-CoV-2 RNA is generally detectable in upper and lower  respiratory specimens dur ing the acute phase of infection.  Positive  results are indicative of active infection with SARS-CoV-2.  Clinical  correlation with patient history and other diagnostic information is  necessary to determine patient infection status.  Positive results do  not rule out bacterial infection or co-infection with other viruses. If result is PRESUMPTIVE POSTIVE SARS-CoV-2 nucleic acids MAY BE PRESENT.   A  presumptive positive result was obtained on the submitted specimen  and confirmed on repeat testing.  While 2019 novel coronavirus  (SARS-CoV-2) nucleic acids may be present in the submitted sample  additional confirmatory testing may be necessary for epidemiological  and / or clinical management purposes  to differentiate between  SARS-CoV-2 and other Sarbecovirus currently known to infect humans.  If clinically indicated additional testing with an alternate test  methodology [id number]) is advised. The SARS-CoV-2 RNA is generally  detectable in upper and lower respiratory sp ecimens during the acute  phase of infection. The expected result is Negative. Fact Sheet for Patients:  BoilerBrush.com.cyhttps://www.fda.gov/media/136312/download Fact Sheet for Healthcare Providers: https://pope.com/https://www.fda.gov/media/136313/download This test is not yet approved or cleared by the [place] FDA and has been authorized for detection and/or diagnosis of SARS-CoV-2 by FDA under an Emergency Use Authorization (EUA).  This EUA will remain in effect (meaning this test can be used) for the duration of the COVID-19 declaration under Section 564(b)(1) of the Act, 21 U.S.C. section 360bbb-3(b)(1), unless the authorization is terminated or revoked sooner. Performed at [facility], [address] [place], [place] [address]      Radiology Studies: Dg Chest Portable 1 View  Result Date: 11/20/2018 CLINICAL DATA:  Chest pain. EXAM: PORTABLE CHEST 1 VIEW COMPARISON:  None. FINDINGS: The heart size and mediastinal contours are within normal limits. Both lungs are clear. The visualized skeletal structures are unremarkable. IMPRESSION: No active disease. Electronically Signed   By: Obie DredgeWilliam T Derry M.D.   On: 11/20/2018 18:12    Scheduled Meds: . amLODipine  5 mg Oral Daily  . aspirin EC  81 mg Oral Daily  . insulin aspart  0-9 Units Subcutaneous Q4H  . losartan  50 mg Oral QPM  . metoprolol succinate  25 mg Oral Daily  .  pravastatin  80 mg Oral q1800  . tamsulosin  0.4 mg Oral QPC breakfast   Continuous Infusions: . sodium chloride 50 mL/hr at 11/22/18 1033  . heparin 1,400 Units/hr (11/21/18 1636)     LOS: 2 days    Time spent: 35 minutes. Greater than 50% of the time dedicated to face to face evaluation, coordinating care and discussing relevant ongoing clinical issues, including need for heart cath, constant monitoring and titration of heparin drip and risk factors modification. All questions answered to patient's satisfaction and nursing staff updated.    Vassie Lollarlos Jabrea Kallstrom, MD [facility] Pager [phone]  11/22/2018, 1:34 PM

## 2018-11-22 NOTE — Progress Notes (Signed)
Resting peacefully. Awaiting echo. Plan for cath on Monday  Lissy Deuser J Avice Funchess, MD [facility] Pager: [phone] Office: [phone] If no answer Cell [phone]   

## 2018-11-22 NOTE — H&P (View-Only) (Signed)
Resting peacefully. Awaiting echo. Plan for cath on Monday  Nigel Mormon, MD [facility] Pager: [phone] Office: [phone] If no answer Cell [phone]

## 2018-11-23 ENCOUNTER — Encounter (HOSPITAL_COMMUNITY)
Admission: EM | Disposition: A | Discharge status: Home / Self Care | Payer: Self-pay | Source: Home / Self Care | Attending: Internal Medicine

## 2018-11-23 DIAGNOSIS — I255 Ischemic cardiomyopathy: Secondary | ICD-10-CM

## 2018-11-23 HISTORY — PX: LEFT HEART CATH AND CORONARY ANGIOGRAPHY: CATH118249

## 2018-11-23 HISTORY — PX: CORONARY STENT INTERVENTION: CATH118234

## 2018-11-23 LAB — CBC
HCT: 45.2 % (ref 39.0–52.0)
Hemoglobin: 15 g/dL (ref 13.0–17.0)
MCH: 29.1 pg (ref 26.0–34.0)
MCHC: 33.2 g/dL (ref 30.0–36.0)
MCV: 87.8 fL (ref 80.0–100.0)
Platelets: 254 10*3/uL (ref 150–400)
RBC: 5.15 MIL/uL (ref 4.22–5.81)
RDW: 12.6 % (ref 11.5–15.5)
WBC: 7.8 10*3/uL (ref 4.0–10.5)
nRBC: 0 % (ref 0.0–0.2)

## 2018-11-23 LAB — GLUCOSE, CAPILLARY
Glucose-Capillary: 103 mg/dL — ABNORMAL HIGH (ref 70–99)
Glucose-Capillary: 103 mg/dL — ABNORMAL HIGH (ref 70–99)
Glucose-Capillary: 113 mg/dL — ABNORMAL HIGH (ref 70–99)
Glucose-Capillary: 115 mg/dL — ABNORMAL HIGH (ref 70–99)
Glucose-Capillary: 88 mg/dL (ref 70–99)
Glucose-Capillary: 98 mg/dL (ref 70–99)

## 2018-11-23 LAB — HEPARIN LEVEL (UNFRACTIONATED): Heparin Unfractionated: 0.57 IU/mL (ref 0.30–0.70)

## 2018-11-23 LAB — BASIC METABOLIC PANEL
Anion gap: 9 (ref 5–15)
BUN: 9 mg/dL (ref 8–23)
CO2: 23 mmol/L (ref 22–32)
Calcium: 9 mg/dL (ref 8.9–10.3)
Chloride: 106 mmol/L (ref 98–111)
Creatinine, Ser: 1.06 mg/dL (ref 0.61–1.24)
GFR calc Af Amer: >60 mL/min (ref >60)
GFR calc non Af Amer: >60 mL/min (ref >60)
Glucose, Bld: 110 mg/dL — ABNORMAL HIGH (ref 70–99)
Potassium: 3.9 mmol/L (ref 3.5–5.1)
Sodium: 138 mmol/L (ref 135–145)

## 2018-11-23 LAB — POCT ACTIVATED CLOTTING TIME
Activated Clotting Time: 450 seconds
Activated Clotting Time: 604 seconds

## 2018-11-23 SURGERY — LEFT HEART CATH AND CORONARY ANGIOGRAPHY
Anesthesia: LOCAL

## 2018-11-23 MED ORDER — SODIUM CHLORIDE 0.9 % WEIGHT BASED INFUSION
3.0000 mL/kg/h | INTRAVENOUS | Status: AC
  Administered 2018-11-23: 3 mL/kg/h via INTRAVENOUS

## 2018-11-23 MED ORDER — ONDANSETRON HCL 4 MG/2ML IJ SOLN: 4.0000 mg | Freq: Four times a day (QID) | INTRAMUSCULAR | Status: DC | PRN

## 2018-11-23 MED ORDER — TIROFIBAN HCL IN NACL 5-0.9 MG/100ML-% IV SOLN
0.0750 ug/kg/min | INTRAVENOUS | Status: AC
  Administered 2018-11-23: 0.075 ug/kg/min via INTRAVENOUS
  Filled 2018-11-23: qty 100

## 2018-11-23 MED ORDER — TIROFIBAN HCL IN NACL 5-0.9 MG/100ML-% IV SOLN
INTRAVENOUS | Status: AC
  Filled 2018-11-23: qty 100

## 2018-11-23 MED ORDER — MIDAZOLAM HCL 2 MG/2ML IJ SOLN
INTRAMUSCULAR | Status: AC
  Filled 2018-11-23: qty 2

## 2018-11-23 MED ORDER — TIROFIBAN HCL IN NACL 5-0.9 MG/100ML-% IV SOLN: 0.1500 ug/kg/min | INTRAVENOUS | Status: DC

## 2018-11-23 MED ORDER — LIDOCAINE HCL (PF) 1 % IJ SOLN
INTRAMUSCULAR | Status: DC | PRN
  Administered 2018-11-23: 2 mL

## 2018-11-23 MED ORDER — VERAPAMIL HCL 2.5 MG/ML IV SOLN
INTRAVENOUS | Status: DC | PRN
  Administered 2018-11-23: 12:00:00 via INTRA_ARTERIAL

## 2018-11-23 MED ORDER — ASPIRIN 81 MG PO CHEW
81.0000 mg | CHEWABLE_TABLET | ORAL | Status: AC
  Administered 2018-11-23: 81 mg via ORAL
  Filled 2018-11-23: qty 1

## 2018-11-23 MED ORDER — TIROFIBAN HCL IN NACL 5-0.9 MG/100ML-% IV SOLN
INTRAVENOUS | Status: AC | PRN
  Administered 2018-11-23: 0.075 ug/kg/min via INTRAVENOUS

## 2018-11-23 MED ORDER — HEPARIN SODIUM (PORCINE) 1000 UNIT/ML IJ SOLN
INTRAMUSCULAR | Status: AC
  Filled 2018-11-23: qty 1

## 2018-11-23 MED ORDER — HYDRALAZINE HCL 20 MG/ML IJ SOLN: 10.0000 mg | INTRAMUSCULAR | Status: AC | PRN

## 2018-11-23 MED ORDER — TICAGRELOR 90 MG PO TABS
ORAL_TABLET | ORAL | Status: DC | PRN
  Administered 2018-11-23: 180 mg via ORAL

## 2018-11-23 MED ORDER — SODIUM CHLORIDE 0.9% FLUSH
3.0000 mL | Freq: Two times a day (BID) | INTRAVENOUS | Status: DC
  Administered 2018-11-23 (×2): 3 mL via INTRAVENOUS

## 2018-11-23 MED ORDER — TIROFIBAN (AGGRASTAT) BOLUS VIA INFUSION
INTRAVENOUS | Status: DC | PRN
  Administered 2018-11-23: 2822.5 ug via INTRAVENOUS

## 2018-11-23 MED ORDER — TICAGRELOR 90 MG PO TABS
90.0000 mg | ORAL_TABLET | Freq: Two times a day (BID) | ORAL | Status: DC
  Administered 2018-11-23 – 2018-11-24 (×2): 90 mg via ORAL
  Filled 2018-11-23 (×2): qty 1

## 2018-11-23 MED ORDER — SODIUM CHLORIDE 0.9 % WEIGHT BASED INFUSION
1.0000 mL/kg/h | INTRAVENOUS | Status: DC
  Administered 2018-11-23 (×2): 1 mL/kg/h via INTRAVENOUS

## 2018-11-23 MED ORDER — SODIUM CHLORIDE 0.9 % IV SOLN: INTRAVENOUS | Status: AC

## 2018-11-23 MED ORDER — HEPARIN (PORCINE) IN NACL 1000-0.9 UT/500ML-% IV SOLN
INTRAVENOUS | Status: AC
  Filled 2018-11-23: qty 1000

## 2018-11-23 MED ORDER — LABETALOL HCL 5 MG/ML IV SOLN: 10.0000 mg | INTRAVENOUS | Status: AC | PRN

## 2018-11-23 MED ORDER — MIDAZOLAM HCL 2 MG/2ML IJ SOLN
INTRAMUSCULAR | Status: DC | PRN
  Administered 2018-11-23: 1 mg via INTRAVENOUS

## 2018-11-23 MED ORDER — HEPARIN (PORCINE) IN NACL 1000-0.9 UT/500ML-% IV SOLN
INTRAVENOUS | Status: DC | PRN
  Administered 2018-11-23 (×2): 500 mL

## 2018-11-23 MED ORDER — SODIUM CHLORIDE 0.9% FLUSH: 3.0000 mL | INTRAVENOUS | Status: DC | PRN

## 2018-11-23 MED ORDER — NITROGLYCERIN 1 MG/10 ML FOR IR/CATH LAB
INTRA_ARTERIAL | Status: AC
  Filled 2018-11-23: qty 10

## 2018-11-23 MED ORDER — IOHEXOL 350 MG/ML SOLN
INTRAVENOUS | Status: DC | PRN
  Administered 2018-11-23: 205 mL via INTRA_ARTERIAL

## 2018-11-23 MED ORDER — TICAGRELOR 90 MG PO TABS
ORAL_TABLET | ORAL | Status: AC
  Filled 2018-11-23: qty 2

## 2018-11-23 MED ORDER — HEPARIN SODIUM (PORCINE) 1000 UNIT/ML IJ SOLN
INTRAMUSCULAR | Status: DC | PRN
  Administered 2018-11-23 (×2): 6000 [IU] via INTRAVENOUS

## 2018-11-23 MED ORDER — SODIUM CHLORIDE 0.9 % IV SOLN: 250.0000 mL | INTRAVENOUS | Status: DC | PRN

## 2018-11-23 MED ORDER — VERAPAMIL HCL 2.5 MG/ML IV SOLN
INTRAVENOUS | Status: AC
  Filled 2018-11-23: qty 4

## 2018-11-23 MED ORDER — LIDOCAINE HCL (PF) 1 % IJ SOLN
INTRAMUSCULAR | Status: AC
  Filled 2018-11-23: qty 30

## 2018-11-23 MED ORDER — FENTANYL CITRATE (PF) 100 MCG/2ML IJ SOLN
INTRAMUSCULAR | Status: AC
  Filled 2018-11-23: qty 2

## 2018-11-23 MED ORDER — ACETAMINOPHEN 325 MG PO TABS: 650.0000 mg | ORAL_TABLET | ORAL | Status: DC | PRN

## 2018-11-23 MED ORDER — FENTANYL CITRATE (PF) 100 MCG/2ML IJ SOLN
INTRAMUSCULAR | Status: DC | PRN
  Administered 2018-11-23: 50 ug via INTRAVENOUS

## 2018-11-23 MED ORDER — SODIUM CHLORIDE 0.9% FLUSH
3.0000 mL | Freq: Two times a day (BID) | INTRAVENOUS | Status: DC
  Administered 2018-11-24: 3 mL via INTRAVENOUS

## 2018-11-23 MED ORDER — NITROGLYCERIN 1 MG/10 ML FOR IR/CATH LAB
INTRA_ARTERIAL | Status: DC | PRN
  Administered 2018-11-23 (×2): 200 ug via INTRACORONARY

## 2018-11-23 SURGICAL SUPPLY — 20 items
BALLN SAPPHIRE 2.5X15 (BALLOONS) ×2
BALLN SAPPHIRE 3.0X15 (BALLOONS) ×2
BALLOON SAPPHIRE 2.5X15 (BALLOONS) ×1 IMPLANT
BALLOON SAPPHIRE 3.0X15 (BALLOONS) ×1 IMPLANT
CATH 5FR JL3.5 JR4 ANG PIG MP (CATHETERS) ×2 IMPLANT
CATH LAUNCHER 5F 3DRIGHT (CATHETERS) ×1 IMPLANT
CATH LAUNCHER 6FR EBU3.5 (CATHETERS) ×2 IMPLANT
CATHETER LAUNCHER 5F 3DRIGHT (CATHETERS) ×2
GLIDESHEATH SLEND A-KIT 6F 22G (SHEATH) ×2 IMPLANT
GUIDEWIRE INQWIRE 1.5J.035X260 (WIRE) ×2 IMPLANT
INQWIRE 1.5J .035X260CM (WIRE) ×4
KIT ENCORE 26 ADVANTAGE (KITS) ×2 IMPLANT
KIT HEART LEFT (KITS) ×2 IMPLANT
KIT HEMO VALVE WATCHDOG (MISCELLANEOUS) ×2 IMPLANT
PACK CARDIAC CATHETERIZATION (CUSTOM PROCEDURE TRAY) ×2 IMPLANT
STENT SYNERGY DES 3X20 (Permanent Stent) ×2 IMPLANT
SYR MEDRAD MARK 7 150ML (SYRINGE) ×2 IMPLANT
TRANSDUCER W/STOPCOCK (MISCELLANEOUS) ×2 IMPLANT
TUBING CIL FLEX 10 FLL-RA (TUBING) ×2 IMPLANT
WIRE ASAHI PROWATER 180CM (WIRE) ×2 IMPLANT

## 2018-11-23 NOTE — Progress Notes (Signed)
Subjective:  No chest pain.  Objective:  Vital Signs in the last 24 hours: Temp:  [98.3 F (36.8 C)-98.6 F (37 C)] 98.6 F (37 C) (06/08 1107) Pulse Rate:  [50-74] 55 (06/08 1407) Resp:  [9-19] 10 (06/08 1407) BP: (125-165)/(71-94) 146/78 (06/08 1407) SpO2:  [0 %-100 %] 0 % (06/08 1341) Weight:  [112.9 kg] 112.9 kg (06/08 0358)  Intake/Output from previous day: 06/07 0701 - 06/08 0700 In: 1331.6 [P.O.:720; I.V.:611.6] Out: 3100 [Urine:3100]  Physical Exam Constitutional: He is oriented to person, place, and time. He appears well-developed and well-nourished. No distress.  HENT:  Head: Normocephalic and atraumatic.  Eyes: Pupils are equal, round, and reactive to light. Conjunctivae are normal.  Neck: No JVD present.  Cardiovascular: Normal rate, regular rhythm and intact distal pulses.  Pulmonary/Chest: Effort normal and breath sounds normal. He has no wheezes. He has no rales.  Abdominal: Soft. Bowel sounds are normal. There is no rebound.  Musculoskeletal:        General: No edema.  Lymphadenopathy:    He has no cervical adenopathy.  Neurological: He is alert and oriented to person, place, and time. No cranial nerve deficit.  Skin: Skin is warm and dry.  Psychiatric: He has a normal mood and affect.  Nursing note and vitals reviewed.   Lab Results: BMP Recent Labs    11/20/18 1737 11/21/18 0405 11/23/18 0551  NA 143 140 138  K 3.9 4.0 3.9  CL 108 107 106  CO2 24 22 23   GLUCOSE 192* 110* 110*  BUN 13 12 9   CREATININE 1.58* 1.06 1.06  CALCIUM 9.7 9.0 9.0  GFRNONAA 45* >60 >60  GFRAA 52* >60 >60    CBC Recent Labs  Lab 11/20/18 1737  11/23/18 0551  WBC 10.6*   < > 7.8  RBC 5.45   < > 5.15  HGB 15.8   < > 15.0  HCT 48.3   < > 45.2  PLT 275   < > 254  MCV 88.6   < > 87.8  MCH 29.0   < > 29.1  MCHC 32.7   < > 33.2  RDW 12.8   < > 12.6  LYMPHSABS 4.0  --   --   MONOABS 0.8  --   --   EOSABS 0.1  --   --   BASOSABS 0.1  --   --    < > = values in  this interval not displayed.    HEMOGLOBIN A1C Lab Results  Component Value Date   HGBA1C 7.6 (H) 11/21/2018   MPG 171.42 11/21/2018    Cardiac Panel (last 3 results) Recent Labs    11/20/18 2241 11/21/18 0405 11/21/18 1058  TROPONINI 0.66* 0.94* 0.60*    BNP (last 3 results) Recent Labs    11/20/18 1737  BNP 28.2    Lipid Panel     Component Value Date/Time   CHOL 103 11/22/2018 0504   TRIG 55 11/22/2018 0504   HDL 34 (L) 11/22/2018 0504   CHOLHDL 3.0 11/22/2018 0504   VLDL 11 11/22/2018 0504   LDLCALC 58 11/22/2018 0504     Hepatic Function Panel Recent Labs    11/20/18 1737  PROT 7.6  ALBUMIN 4.0  AST 21  ALT 29  ALKPHOS 46  BILITOT 0.7  BILIDIR <0.1  IBILI NOT CALCULATED   CARDIAC STUDIES:  EKG 11/21/2018: Sinus rhythm, first degree AV block. LBBB  Coronary angiography 11/23/2018: LM: Normal LAD: Mid 95% stenosis LCx: OM1 ostial  40%, mid LCx 40% stenosis. RCA: Normal with minimal luminal irregularities.  Successful percutaneous coronary intervention mid LAD PTCA and stent placement Synergy 3.0 X 20 mm drug-eluting stent Post intervention TIMI flow II likely due to distal thromboembolization. Aggrastat bolus and infusion started.  Echocardiogram 11/22/2018:  1. Severe hypokinesis of the left ventricular, entire apical segment.  2. The left ventricle has moderate-severely reduced systolic function, with an ejection fraction of 30-35%. The cavity size was normal. There is mildly increased left ventricular wall thickness. Left ventricular diastolic Doppler parameters are  consistent with impaired relaxation. Left ventricular diffuse hypokinesis.  3. The right ventricle has normal systolic function. The cavity was normal. There is no increase in right ventricular wall thickness.  4. There is mild mitral annular calcification present.  5. The aortic valve is tricuspid.   Echocardiogram 2017: - Left ventricle: Systolic function was mildly  reduced. The estimated ejection fraction was in the range of 45% to 50%. Akinesis of the apical myocardium. Doppler parameters are consistent with abnormal left ventricular relaxation (grade 1 diastolic dysfunction). - Aortic valve: There was trivial regurgitation. - Left atrium: The atrium was mildly dilated.    Assessment & Recommendations:  [age] y.o. African American male  with hypertension, hyperlipidemia, type 2 DM, BPH, now admitted with NSTEMI.  NSTEMI: Mid LAD 95% stenosis, treated with successful PTCA and stent placement Synergy 3.0 X 20 mm drug-eluting stent. 0% residual stenosis, but distal thromboembolization requiring aggrastat infusion. Chest pain free. Aspirin/Brilinta/metoprolol succinate 25 mg/amlodipine 5 mg/crestor 40 mg. Resume losartan on Tuesday.  HFrEF: New diagnosis. Ischemic cardiomyopathy. Except this to improve. If not, may need Entresto. Continue above medications for now.   Palpitations: Possible angina equivalent. No arrhthymias thus far. May need to consider event monitor outpatient.   Type 2 DM: Hold metformin while in the hospital. Consider adding Jardiance.   Elder NegusManish J Belkys Henault, M.D. 11/23/2018, 4:49 PM [facility] Pager: [phone] Office: [phone] If no answer: [phone]

## 2018-11-23 NOTE — Progress Notes (Signed)
[place] for heparin Indication: chest pain/ACS  Heparin Dosing Weight: 91.2 kg  Labs: Recent Labs    11/20/18 1737  11/20/18 2241  11/21/18 0405 11/21/18 1058 11/21/18 1441 11/22/18 0504 11/23/18 0551  HGB 15.8  --   --   --  14.5  --   --  14.4 15.0  HCT 48.3  --   --   --  43.6  --   --  43.6 45.2  PLT 275  --   --   --  247  --   --  237 254  APTT 29  --   --   --   --   --   --   --   --   LABPROT 13.3  --   --   --   --   --   --   --   --   INR 1.0  --   --   --   --   --   --   --   --   HEPARINUNFRC  --   --   --    < > 0.26*  --  0.72* 0.56 0.57  CREATININE 1.58*  --   --   --  1.06  --   --   --  1.06  TROPONINI 0.03*   < > 0.66*  --  0.94* 0.60*  --   --   --    < > = values in this interval not displayed.    Assessment: 40 yoM presenting with CP. Pharmacy consulted to dose heparin for ACS. Not on anticoagulation PTA.  Heparin level remains therapeutic, CBC is stable and wnl. L heart cath planned for today.   Goal of Therapy:  Heparin level 0.3-0.7 units/ml Monitor platelets by anticoagulation protocol: Yes   Plan:  -Continue heparin at 1400 units/hr -Daily heparin level and CBC -Monitor for s/sx of bleeding -Consider transitioning pravastatin to high-intensity statin if cath reveals CAD   Arrie Senate, PharmD, BCPS Clinical Pharmacist [phone] Please check AMION for all [place] numbers 11/23/2018

## 2018-11-23 NOTE — Progress Notes (Signed)
PROGRESS NOTE  Colin Weaver ONG:[id number] DOB: 29-Jun-1950 DOA: 11/20/2018 PCP: Darci NeedleKohut, Walter, MD   LOS: 3 days   Brief narrative: 68 y.o.malewith medical history significant forhypertension, type 2 diabetes mellitus, and mild chronic renal insufficiency, presented to the emergency department after an acute episode of chest discomfort, diaphoresis, and lightheadedness. Patient reports that he woke in his usual state of health, was out doing yard work, and then developed an intense discomfort in his chest that he has difficulty describing, lightheadedness, and was reportedly diaphoretic. Patient laid down at home, took his evening medications, but his symptoms persisted and EMS was called. Patient was reportedly hypotensive with EMS, had LBBB on EKG, and was brought in as code STEMI. He was given full dose aspirin prior to arrival.  Upon arrival to the ED, patient is found to be saturating well on room air, normal heart rate and blood pressure, no respiratory distress, but appeared uncomfortable and diaphoretic per ED physician report. EKG features a sinus rhythm with LBBB. Chest x-ray was negative for acute cardiopulmonary disease. Chemistry panel was notable for glucose 192 and creatinine 1.58. Patient did have elevated troponin and cardiology was consulted.  Patient is awaiting for cardiac catheterization 11/23/2018.  Subjective: Patient denies any shortness of breath, chest pain, dizziness, diaphoresis.  Assessment/Plan:  Principal Problem:   NSTEMI (non-ST elevated myocardial infarction) (HCC) Active Problems:   Hypertension   Diabetes mellitus type II, non insulin dependent (HCC)   Chronic renal insufficiency, stage 2 (mild)  NSTEMI.  Currently chest pain-free.  Seen by cardiology, awaiting for cardiac cath.  Continue heparin drip, aspirin, beta-blockers and statins.   Essential hypertension. Continue metoprolol, losartan and Norvasc.  Closely monitor blood pressure.   Diabetes mellitus type II, with nephropathy. A1c 7.6. Hold metformin.  Continue sliding scale insulin, Accu-Cheks.  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  Creatinine on presentation was 1.5 now 1.06.  Received gentle IV fluids.  Check BMP in a.m.  Hyperlipidemia -Continue statins  BPH -Continue Flomax   VTE Prophylaxis: Heparin drip  Code Status: Full code  Family Communication: None  Disposition Plan: Home  Consultants:  Cardiology  Procedures:  Cardiac catheterization planned today  Antibiotics: Anti-infectives (From admission, onward)   None       Objective: Vitals:   11/23/18 1107 11/23/18 1158  BP: 140/71   Pulse: (!) 54   Resp:    Temp: 98.6 F (37 C)   SpO2: 98% 99%    Intake/Output Summary (Last 24 hours) at 11/23/2018 1243 Last data filed at 11/23/2018 1000 Gross per 24 hour  Intake 1544 ml  Output 3000 ml  Net -1456 ml   Filed Weights   11/21/18 0435 11/22/18 0405 11/23/18 0358  Weight: 112.8 kg 112.4 kg 112.9 kg   Body mass index is 38.98 kg/m.   Physical Exam: GENERAL: Patient is alert awake and oriented. Not in obvious distress. Morbidly obese HENT: No scleral pallor or icterus. Pupils equally reactive to light. Oral mucosa is moist NECK: is supple, no palpable thyroid enlargement. CHEST: Clear to auscultation. No crackles or wheezes. Non tender on palpation. Diminished breath sounds bilaterally. CVS: S1 and S2 heard, no murmur. Regular rate and rhythm. No pericardial rub. ABDOMEN: Soft, non-tender, bowel sounds are present. No palpable hepato-splenomegaly. EXTREMITIES: No edema. CNS: Cranial nerves are intact. No focal motor or sensory deficits. SKIN: warm and dry without rashes.  Data Review: I have personally reviewed the following laboratory data and studies,  CBC: Recent  Labs  Lab 11/20/18 1737 11/21/18 0405 11/22/18 0504 11/23/18 0551  WBC 10.6* 7.7 7.4 7.8  NEUTROABS 5.7  --   --    --   HGB 15.8 14.5 14.4 15.0  HCT 48.3 43.6 43.6 45.2  MCV 88.6 87.4 86.9 87.8  PLT 275 247 237 892   Basic Metabolic Panel: Recent Labs  Lab 11/20/18 1737 11/21/18 0405 11/23/18 0551  NA 143 140 138  K 3.9 4.0 3.9  CL 108 107 106  CO2 24 22 23   GLUCOSE 192* 110* 110*  BUN 13 12 9   CREATININE 1.58* 1.06 1.06  CALCIUM 9.7 9.0 9.0   Liver Function Tests: Recent Labs  Lab 11/20/18 1737  AST 21  ALT 29  ALKPHOS 46  BILITOT 0.7  PROT 7.6  ALBUMIN 4.0   No results for input(s): LIPASE, AMYLASE in the last 168 hours. No results for input(s): AMMONIA in the last 168 hours. Cardiac Enzymes: Recent Labs  Lab 11/20/18 1737 11/20/18 1951 11/20/18 2241 11/21/18 0405 11/21/18 1058  TROPONINI 0.03* 0.30* 0.66* 0.94* 0.60*   BNP (last 3 results) Recent Labs    11/20/18 1737  BNP 28.2    ProBNP (last 3 results) No results for input(s): PROBNP in the last 8760 hours.  CBG: Recent Labs  Lab 11/22/18 2007 11/22/18 2343 11/23/18 0525 11/23/18 0823 11/23/18 1127  GLUCAP 169* 113* 98 103* 115*   Recent Results (from the past 240 hour(s))  SARS Coronavirus 2 (CEPHEID - Performed in [facility] hospital lab), Hosp Order     Status: None   Collection Time: 11/20/18  6:54 PM  Result Value Ref Range Status   SARS Coronavirus 2 NEGATIVE NEGATIVE Final    Comment: (NOTE) If result is NEGATIVE SARS-CoV-2 target nucleic acids are NOT DETECTED. The SARS-CoV-2 RNA is generally detectable in upper and lower  respiratory specimens during the acute phase of infection. The lowest  concentration of SARS-CoV-2 viral copies this assay can detect is 250  copies / mL. A negative result does not preclude SARS-CoV-2 infection  and should not be used as the sole basis for treatment or other  patient management decisions.  A negative result may occur with  improper specimen collection / handling, submission of specimen other  than nasopharyngeal swab, presence of viral mutation(s)  within the  areas targeted by this assay, and inadequate number of viral copies  (<250 copies / mL). A negative result must be combined with clinical  observations, patient history, and epidemiological information. If result is POSITIVE SARS-CoV-2 target nucleic acids are DETECTED. The SARS-CoV-2 RNA is generally detectable in upper and lower  respiratory specimens dur ing the acute phase of infection.  Positive  results are indicative of active infection with SARS-CoV-2.  Clinical  correlation with patient history and other diagnostic information is  necessary to determine patient infection status.  Positive results do  not rule out bacterial infection or co-infection with other viruses. If result is PRESUMPTIVE POSTIVE SARS-CoV-2 nucleic acids MAY BE PRESENT.   A presumptive positive result was obtained on the submitted specimen  and confirmed on repeat testing.  While 2019 novel coronavirus  (SARS-CoV-2) nucleic acids may be present in the submitted sample  additional confirmatory testing may be necessary for epidemiological  and / or clinical management purposes  to differentiate between  SARS-CoV-2 and other Sarbecovirus currently known to infect humans.  If clinically indicated additional testing with an alternate test  methodology [phone]) is advised. The SARS-CoV-2 RNA is generally  detectable in upper and lower respiratory sp ecimens during the acute  phase of infection. The expected result is Negative. Fact Sheet for Patients:  BoilerBrush.com.cyhttps://www.fda.gov/media/136312/download Fact Sheet for Healthcare Providers: https://pope.com/https://www.fda.gov/media/136313/download This test is not yet approved or cleared by the [place] FDA and has been authorized for detection and/or diagnosis of SARS-CoV-2 by FDA under an Emergency Use Authorization (EUA).  This EUA will remain in effect (meaning this test can be used) for the duration of the COVID-19 declaration under Section 564(b)(1) of the Act,  21 U.S.C. section 360bbb-3(b)(1), unless the authorization is terminated or revoked sooner. Performed at [facility], [address] [place], [place] [address]      Studies: No results found.  Scheduled Meds: . [MAR Hold] amLODipine  5 mg Oral Daily  . [MAR Hold] aspirin EC  81 mg Oral Daily  . [MAR Hold] insulin aspart  0-9 Units Subcutaneous Q4H  . [MAR Hold] losartan  50 mg Oral QPM  . [MAR Hold] metoprolol succinate  25 mg Oral Daily  . [MAR Hold] pravastatin  80 mg Oral q1800  . sodium chloride flush  3 mL Intravenous Q12H  . [MAR Hold] tamsulosin  0.4 mg Oral QPC breakfast    Continuous Infusions: . sodium chloride    . sodium chloride 1 mL/kg/hr (11/23/18 1113)  . heparin Stopped (11/23/18 1120)     Joycelyn DasLaxman Chakita Mcgraw, MD  [facility] 11/23/2018

## 2018-11-23 NOTE — Interval H&P Note (Signed)
History and Physical Interval Note:  11/23/2018 11:59 AM  Colin Weaver  has presented today for surgery, with the diagnosis of NSTEMI.  The various methods of treatment have been discussed with the patient and family. After consideration of risks, benefits and other options for treatment, the patient has consented to  Procedure(s): LEFT HEART CATH AND CORONARY ANGIOGRAPHY (N/A) as a surgical intervention.  The patient's history has been reviewed, patient examined, no change in status, stable for surgery.  I have reviewed the patient's chart and labs.  Questions were answered to the patient's satisfaction.    2016 Appropriate Use Criteria for Coronary Revascularization in Patients With Acute Coronary Syndrome NSTEMI/UA High Risk (TIMI Score 5-7) NSTEMI/Unstable angina, stabilized patient at high risk Link Here: sistemancia.com Indication:  Revascularization by PCI or CABG of 1 or more arteries in a patient with NSTEMI or unstable angina with Stabilization after presentation High risk for clinical events  A (7) Indication: 16; Score 7     [place]

## 2018-11-24 ENCOUNTER — Encounter (HOSPITAL_COMMUNITY): Payer: Self-pay | Admitting: Cardiology

## 2018-11-24 DIAGNOSIS — R002 Palpitations: Secondary | ICD-10-CM

## 2018-11-24 LAB — CBC
HCT: 43.2 % (ref 39.0–52.0)
Hemoglobin: 14.2 g/dL (ref 13.0–17.0)
MCH: 28.4 pg (ref 26.0–34.0)
MCHC: 32.9 g/dL (ref 30.0–36.0)
MCV: 86.4 fL (ref 80.0–100.0)
Platelets: 242 10*3/uL (ref 150–400)
RBC: 5 MIL/uL (ref 4.22–5.81)
RDW: 12.7 % (ref 11.5–15.5)
WBC: 7.9 10*3/uL (ref 4.0–10.5)
nRBC: 0 % (ref 0.0–0.2)

## 2018-11-24 LAB — GLUCOSE, CAPILLARY
Glucose-Capillary: 111 mg/dL — ABNORMAL HIGH (ref 70–99)
Glucose-Capillary: 94 mg/dL (ref 70–99)

## 2018-11-24 LAB — BASIC METABOLIC PANEL
Anion gap: 10 (ref 5–15)
BUN: 9 mg/dL (ref 8–23)
CO2: 24 mmol/L (ref 22–32)
Calcium: 9.3 mg/dL (ref 8.9–10.3)
Chloride: 105 mmol/L (ref 98–111)
Creatinine, Ser: 1.06 mg/dL (ref 0.61–1.24)
GFR calc Af Amer: >60 mL/min (ref >60)
GFR calc non Af Amer: >60 mL/min (ref >60)
Glucose, Bld: 122 mg/dL — ABNORMAL HIGH (ref 70–99)
Potassium: 4 mmol/L (ref 3.5–5.1)
Sodium: 139 mmol/L (ref 135–145)

## 2018-11-24 LAB — MAGNESIUM: Magnesium: 1.9 mg/dL (ref 1.7–2.4)

## 2018-11-24 MED ORDER — TICAGRELOR 90 MG PO TABS: 90.0000 mg | ORAL_TABLET | Freq: Two times a day (BID) | ORAL | 0 refills | Status: DC

## 2018-11-24 MED ORDER — TICAGRELOR 90 MG PO TABS: 90.0000 mg | ORAL_TABLET | Freq: Two times a day (BID) | ORAL | 11 refills | Status: DC

## 2018-11-24 MED ORDER — METOPROLOL SUCCINATE ER 25 MG PO TB24: 25.0000 mg | ORAL_TABLET | Freq: Every day | ORAL | 3 refills | Status: DC

## 2018-11-24 MED ORDER — ROSUVASTATIN CALCIUM 40 MG PO TABS: 40.0000 mg | ORAL_TABLET | Freq: Every day | ORAL | 11 refills | Status: DC

## 2018-11-24 MED FILL — BRILINTA 90 MG TABLET: 90 | 30 days supply | Qty: 60 | Fill #0

## 2018-11-24 NOTE — Progress Notes (Signed)
Subjective:  No chest pain. Feels well.  Objective:  Vital Signs in the last 24 hours: Temp:  [98 F (36.7 C)-98.6 F (37 C)] 98.4 F (36.9 C) (06/09 0535) Pulse Rate:  [49-74] 49 (06/09 0535) Resp:  [9-21] 20 (06/09 0535) BP: (125-165)/(64-94) 143/74 (06/09 0535) SpO2:  [0 %-100 %] 97 % (06/09 0535) Weight:  [112.3 kg] 112.3 kg (06/09 0535)  Intake/Output from previous day: 06/08 0701 - 06/09 0700 In: 958 [P.O.:240; I.V.:718] Out: 2850 [Urine:2850]  Physical Exam Constitutional: He is oriented to person, place, and time. He appears well-developed and well-nourished. No distress.  HENT:  Head: Normocephalic and atraumatic.  Eyes: Pupils are equal, round, and reactive to light. Conjunctivae are normal.  Neck: No JVD present.  Cardiovascular: Normal rate, regular rhythm and intact distal pulses.  Pulmonary/Chest: Effort normal and breath sounds normal. He has no wheezes. He has no rales.  Abdominal: Soft. Bowel sounds are normal. There is no rebound.  Musculoskeletal:        General: No edema.  Lymphadenopathy:    He has no cervical adenopathy.  Neurological: He is alert and oriented to person, place, and time. No cranial nerve deficit.  Skin: Skin is warm and dry.  Psychiatric: He has a normal mood and affect.  Nursing note and vitals reviewed.   Lab Results: BMP Recent Labs    11/21/18 0405 11/23/18 0551 11/24/18 0714  NA 140 138 139  K 4.0 3.9 4.0  CL 107 106 105  CO2 22 23 24   GLUCOSE 110* 110* 122*  BUN 12 9 9   CREATININE 1.06 1.06 1.06  CALCIUM 9.0 9.0 9.3  GFRNONAA >60 >60 >60  GFRAA >60 >60 >60    CBC Recent Labs  Lab 11/20/18 1737  11/24/18 0714  WBC 10.6*   < > 7.9  RBC 5.45   < > 5.00  HGB 15.8   < > 14.2  HCT 48.3   < > 43.2  PLT 275   < > 242  MCV 88.6   < > 86.4  MCH 29.0   < > 28.4  MCHC 32.7   < > 32.9  RDW 12.8   < > 12.7  LYMPHSABS 4.0  --   --   MONOABS 0.8  --   --   EOSABS 0.1  --   --   BASOSABS 0.1  --   --    < > =  values in this interval not displayed.    HEMOGLOBIN A1C Lab Results  Component Value Date   HGBA1C 7.6 (H) 11/21/2018   MPG 171.42 11/21/2018    Cardiac Panel (last 3 results) Recent Labs    11/20/18 2241 11/21/18 0405 11/21/18 1058  TROPONINI 0.66* 0.94* 0.60*    BNP (last 3 results) Recent Labs    11/20/18 1737  BNP 28.2    Lipid Panel     Component Value Date/Time   CHOL 103 11/22/2018 0504   TRIG 55 11/22/2018 0504   HDL 34 (L) 11/22/2018 0504   CHOLHDL 3.0 11/22/2018 0504   VLDL 11 11/22/2018 0504   LDLCALC 58 11/22/2018 0504     Hepatic Function Panel Recent Labs    11/20/18 1737  PROT 7.6  ALBUMIN 4.0  AST 21  ALT 29  ALKPHOS 46  BILITOT 0.7  BILIDIR <0.1  IBILI NOT CALCULATED   CARDIAC STUDIES:  EKG 11/21/2018: Sinus rhythm, first degree AV block. LBBB  Coronary angiography 11/23/2018: LM: Normal LAD: Mid 95% stenosis LCx:  OM1 ostial 40%, mid LCx 40% stenosis. RCA: Normal with minimal luminal irregularities.  Successful percutaneous coronary intervention mid LAD PTCA and stent placement Synergy 3.0 X 20 mm drug-eluting stent Post intervention TIMI flow II likely due to distal thromboembolization. Aggrastat bolus and infusion started.  Echocardiogram 11/22/2018:  1. Severe hypokinesis of the left ventricular, entire apical segment.  2. The left ventricle has moderate-severely reduced systolic function, with an ejection fraction of 30-35%. The cavity size was normal. There is mildly increased left ventricular wall thickness. Left ventricular diastolic Doppler parameters are  consistent with impaired relaxation. Left ventricular diffuse hypokinesis.  3. The right ventricle has normal systolic function. The cavity was normal. There is no increase in right ventricular wall thickness.  4. There is mild mitral annular calcification present.  5. The aortic valve is tricuspid.   Echocardiogram 2017: - Left ventricle: Systolic function  was mildly reduced. The estimated ejection fraction was in the range of 45% to 50%. Akinesis of the apical myocardium. Doppler parameters are consistent with abnormal left ventricular relaxation (grade 1 diastolic dysfunction). - Aortic valve: There was trivial regurgitation. - Left atrium: The atrium was mildly dilated.    Assessment & Recommendations:  68 y.o. African American male  with hypertension, hyperlipidemia, type 2 DM, BPH, now admitted with NSTEMI.  NSTEMI: Mid LAD 95% stenosis, treated with successful PTCA and stent placement Synergy 3.0 X 20 mm drug-eluting stent. 0% residual stenosis, but distal thromboembolization requiring aggrastat infusion. Chest pain free. Aspirin/Brilinta/metoprolol succinate 25 mg/amlodipine 5 mg/crestor 40 mg. Resume losartan on discharge.  HFrEF: New diagnosis. Ischemic cardiomyopathy. Except this to improve. If not, may need Entresto. Continue above medications for now. Will repeat outpatient echo in 6 weeks.  Palpitations: Possible angina equivalent. No arrhthymias thus far. May need to consider event monitor outpatient.   Type 2 DM: Hold metformin while in the hospital. Consider adding Jardiance.  Okay to discharge today. Follow up arranged.   Nigel Mormon, M.D. 11/24/2018, 8:39 AM [facility] Pager: [phone] Office: [phone] If no answer: [phone]

## 2018-11-24 NOTE — Discharge Instructions (Signed)

## 2018-11-24 NOTE — Progress Notes (Signed)
CARDIAC REHAB PHASE I   PRE:  Rate/Rhythm: 66 SR    BP: sitting 136/76    SaO2: 95 RA  MODE:  Ambulation: 470 ft   POST:  Rate/Rhythm: 74 SR    BP: sitting 135/101, recheck 141/85     SaO2: 99 RA  Tolerated well, no c/o. Discussed MI, stent, restrictions, Brilinta, diet, exercise, NTG, and CRPII. Pt voiced understanding. He needs better diet control. He walks regularly. Will refer to [facility].  Pt is interested in participating in Virtual Cardiac Rehab. Pt advised that Virtual Cardiac Rehab is provided at no cost to the patient.  Checklist:  1. Pt has smart device  ie smartphone and/or ipad for downloading an app  Yes 2. Reliable internet/wifi service    Yes 3. Understands how to use their smartphone and navigate within an app.  No 4.  Reviewed with pt the scheduling process for virtual cardiac rehab.  Pt verbalized understanding. [place], ACSM 11/24/2018 9:03 AM

## 2018-11-24 NOTE — Discharge Summary (Signed)
Physician Discharge Summary  [name] WUJ:[id number] DOB: 01/22/1951 DOA: 11/20/2018  PCP: Darci NeedleKohut, Walter, MD  Admit date: 11/20/2018 Discharge date: 11/24/2018  Admitted From: Home  Discharge disposition: Home  Recommendations for Outpatient Follow-Up:    Follow up with cardiology and primary care provider.  Discharge Diagnosis:   Principal Problem:   NSTEMI (non-ST elevated myocardial infarction) (HCC) Active Problems:   Hypertension   Diabetes mellitus type II, non insulin dependent (HCC)   Chronic renal insufficiency, stage 2 (mild)  Discharge Condition: Improved.  Diet recommendation: Low sodium, heart healthy.  Carbohydrate-modified.    Wound care: None.  Code status: Full.   History of Present Illness:   68 y.o.malewith medical history significant forhypertension, type 2 diabetes mellitus, and mild chronic renal insufficiency, presented to the emergency department after an acute episode of chest discomfort, diaphoresis, and lightheadedness. Patient reported that he woke in his usual state of health, was out doing yard work, and then developed an intense discomfort in his chest that he has difficulty describing. He also had lightheadedness, and was reportedly diaphoretic. Patient laid down at home, took his evening medications, but his symptoms persisted and EMS was called. Patient was reportedly hypotensive with EMS, had LBBB on EKG, and was brought in as code STEMI. He was given full dose aspirin prior to arrival.  Upon arrival to the ED, patient had normal vitals but appeared uncomfortable and diaphoretic per ED physician report. EKG featured a sinus rhythm with LBBB. Chest x-ray was negative for acute cardiopulmonary disease. Chemistry panel was notable for glucose 192 and creatinine 1.58. Patient did have elevated troponin and cardiology was consulted.     Hospital Course:  Patient was admitted to the hospital and following conditions were addressed  during hospitalization,  NSTEMI.    Patient was chest pain-free after admission.  He underwent cardiac catheterization on 11/23/2018 with placement of stent in the mid LAD.   Patient will be continued on aspirin, Brilinta on discharge.  Patient was taking pravastatin at home will change to Crestor 40 mg as per cardiology recommendation.  Patient will follow up with cardiac rehab and cardiology as outpatient.  Essential hypertension. Continue metoprolol, losartan and Norvasc.   Patient was emphasized the lifestyle modification.  Diabetes mellitus type II, with nephropathy. A1c 7.6.   Patient will be continued on metformin and diabetic diet on discharge.  He was advised to discuss with his primary care physician regarding [facility].  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  Creatinine on presentation was 1.5 which improved with IV fluid hydration.  Renal function will need to be followed up as outpatient.  Hyperlipidemia -Continue statins  BPH -Continue Flomax  Disposition.  At this time patient is a stable for disposition home.  He will have to follow-up with his primary care physician, cardiology as outpatient.  I had an extensive discussion about lifestyle modifications with the patient.   Medical Consultants:    Cardiology   Subjective:   Today, patient feels okay.  Denies any chest pain, shortness of breath, fever, chills or rigors.  Discharge Exam:   Vitals:   11/24/18 0535 11/24/18 0856  BP: (!) 143/74 (!) 141/85  Pulse: (!) 49 62  Resp: 20   Temp: 98.4 F (36.9 C)   SpO2: 97%    Vitals:   11/23/18 2149 11/24/18 0230 11/24/18 0535 11/24/18 0856  BP: (!) 141/72 139/64 (!) 143/74 (!) 141/85  Pulse: (!) 54 (!) 53 Marland Kitchen(!)  49 62  Resp: 14 14 20    Temp: 98.5 F (36.9 C)  98.4 F (36.9 C)   TempSrc: Oral  Oral   SpO2: 100% 97% 97%   Weight:   112.3 kg   Height:        General exam: Appears calm and  comfortable ,Not in distress.  Morbidly obese HEENT:PERRL,Oral mucosa moist Respiratory system: Bilateral equal air entry, normal vesicular breath sounds, no wheezes or crackles  Cardiovascular system: S1 & S2 heard, RRR.  Gastrointestinal system: Abdomen is nondistended, soft and nontender. No organomegaly or masses felt. Normal bowel sounds heard. Central nervous system: Alert and oriented. No focal neurological deficits. Extremities: No edema, no clubbing ,no cyanosis, distal peripheral pulses palpable. Skin: No rashes, lesions or ulcers,no icterus ,no pallor MSK: Normal muscle bulk,tone ,power    Procedures:    Cardiac catheterization with placement of stent in the LAD on 11/23/2018.  The results of significant diagnostics from this hospitalization (including imaging, microbiology, ancillary and laboratory) are listed below for reference.     Diagnostic Studies:   Dg Chest Portable 1 View  Result Date: 11/20/2018 CLINICAL DATA:  Chest pain. EXAM: PORTABLE CHEST 1 VIEW COMPARISON:  None. FINDINGS: The heart size and mediastinal contours are within normal limits. Both lungs are clear. The visualized skeletal structures are unremarkable. IMPRESSION: No active disease. Electronically Signed   By: Titus Dubin M.D.   On: 11/20/2018 18:12     Labs:   Basic Metabolic Panel: Recent Labs  Lab 11/20/18 1737 11/21/18 0405 11/23/18 0551 11/24/18 0714  NA 143 140 138 139  K 3.9 4.0 3.9 4.0  CL 108 107 106 105  CO2 24 22 23 24   GLUCOSE 192* 110* 110* 122*  BUN 13 12 9 9   CREATININE 1.58* 1.06 1.06 1.06  CALCIUM 9.7 9.0 9.0 9.3  MG  --   --   --  1.9   GFR Estimated Creatinine Clearance: 80.9 mL/min (by C-G formula based on SCr of 1.06 mg/dL). Liver Function Tests: Recent Labs  Lab 11/20/18 1737  AST 21  ALT 29  ALKPHOS 46  BILITOT 0.7  PROT 7.6  ALBUMIN 4.0   No results for input(s): LIPASE, AMYLASE in the last 168 hours. No results for input(s): AMMONIA in the last  168 hours. Coagulation profile Recent Labs  Lab 11/20/18 1737  INR 1.0    CBC: Recent Labs  Lab 11/20/18 1737 11/21/18 0405 11/22/18 0504 11/23/18 0551 11/24/18 0714  WBC 10.6* 7.7 7.4 7.8 7.9  NEUTROABS 5.7  --   --   --   --   HGB 15.8 14.5 14.4 15.0 14.2  HCT 48.3 43.6 43.6 45.2 43.2  MCV 88.6 87.4 86.9 87.8 86.4  PLT 275 247 237 254 242   Cardiac Enzymes: Recent Labs  Lab 11/20/18 1737 11/20/18 1951 11/20/18 2241 11/21/18 0405 11/21/18 1058  TROPONINI 0.03* 0.30* 0.66* 0.94* 0.60*   BNP: Invalid input(s): POCBNP CBG: Recent Labs  Lab 11/23/18 0823 11/23/18 1127 11/23/18 1425 11/23/18 2154 11/24/18 0753  GLUCAP 103* 115* 103* 88 111*   D-Dimer No results for input(s): DDIMER in the last 72 hours. Hgb A1c No results for input(s): HGBA1C in the last 72 hours. Lipid Profile Recent Labs    11/22/18 0504  CHOL 103  HDL 34*  LDLCALC 58  TRIG 55  CHOLHDL 3.0   Thyroid function studies No results for input(s): TSH, T4TOTAL, T3FREE, THYROIDAB in the last 72 hours.  Invalid input(s): FREET3 Anemia  work up No results for input(s): VITAMINB12, FOLATE, FERRITIN, TIBC, IRON, RETICCTPCT in the last 72 hours. Microbiology Recent Results (from the past 240 hour(s))  SARS Coronavirus 2 (CEPHEID - Performed in [facility] hospital lab), Hosp Order     Status: None   Collection Time: 11/20/18  6:54 PM  Result Value Ref Range Status   SARS Coronavirus 2 NEGATIVE NEGATIVE Final    Comment: (NOTE) If result is NEGATIVE SARS-CoV-2 target nucleic acids are NOT DETECTED. The SARS-CoV-2 RNA is generally detectable in upper and lower  respiratory specimens during the acute phase of infection. The lowest  concentration of SARS-CoV-2 viral copies this assay can detect is 250  copies / mL. A negative result does not preclude SARS-CoV-2 infection  and should not be used as the sole basis for treatment or other  patient management decisions.  A negative result may  occur with  improper specimen collection / handling, submission of specimen other  than nasopharyngeal swab, presence of viral mutation(s) within the  areas targeted by this assay, and inadequate number of viral copies  (<250 copies / mL). A negative result must be combined with clinical  observations, patient history, and epidemiological information. If result is POSITIVE SARS-CoV-2 target nucleic acids are DETECTED. The SARS-CoV-2 RNA is generally detectable in upper and lower  respiratory specimens dur ing the acute phase of infection.  Positive  results are indicative of active infection with SARS-CoV-2.  Clinical  correlation with patient history and other diagnostic information is  necessary to determine patient infection status.  Positive results do  not rule out bacterial infection or co-infection with other viruses. If result is PRESUMPTIVE POSTIVE SARS-CoV-2 nucleic acids MAY BE PRESENT.   A presumptive positive result was obtained on the submitted specimen  and confirmed on repeat testing.  While 2019 novel coronavirus  (SARS-CoV-2) nucleic acids may be present in the submitted sample  additional confirmatory testing may be necessary for epidemiological  and / or clinical management purposes  to differentiate between  SARS-CoV-2 and other Sarbecovirus currently known to infect humans.  If clinically indicated additional testing with an alternate test  methodology [phone]([id number]) is advised. The SARS-CoV-2 RNA is generally  detectable in upper and lower respiratory sp ecimens during the acute  phase of infection. The expected result is Negative. Fact Sheet for Patients:  BoilerBrush.com.cyhttps://www.fda.gov/media/136312/download Fact Sheet for Healthcare Providers: https://pope.com/https://www.fda.gov/media/136313/download This test is not yet approved or cleared by the [place] FDA and has been authorized for detection and/or diagnosis of SARS-CoV-2 by FDA under an Emergency Use Authorization (EUA).  This  EUA will remain in effect (meaning this test can be used) for the duration of the COVID-19 declaration under Section 564(b)(1) of the Act, 21 U.S.C. section 360bbb-3(b)(1), unless the authorization is terminated or revoked sooner. Performed at [facility], [address] [place], [place] [address]      Discharge Instructions:   Discharge Instructions    Amb Referral to Cardiac Rehabilitation   Complete by:  As directed    Diagnosis:   Coronary Stents NSTEMI     After initial evaluation and assessments completed: Virtual Based Care may be provided alone or in conjunction with Phase 2 Cardiac Rehab based on patient barriers.:  Yes   Diet - low sodium heart healthy   Complete by:  As directed    Discharge instructions   Complete by:  As directed    Follow-up with cardiology as scheduled.  You have been prescribed new medications  for your heart please continue those medications.  Follow-up with cardiac rehab as outpatient.  Seek medical attention for worsening symptoms including recurrent chest pain, shortness of breath.  Follow-up with your primary care physician in 1 to 2 weeks to discuss about  general health including diabetes.   Increase activity slowly   Complete by:  As directed      Allergies as of 11/24/2018      Reactions   Lisinopril Cough   Allergy or intolerance (??)      Medication List    STOP taking these medications   meloxicam 15 MG tablet Commonly known as:  MOBIC     TAKE these medications   amLODipine 5 MG tablet Commonly known as:  NORVASC Take 5 mg by mouth daily.   AndroGel Pump 20.25 MG/ACT (1.62%) Gel Generic drug:  Testosterone Apply 1 application topically See admin instructions. Apply to each shoulder daily as directed   aspirin EC 81 MG tablet Take 81 mg by mouth daily.   losartan 50 MG tablet Commonly known as:  COZAAR Take 50 mg by mouth every evening.   metFORMIN 500 MG tablet Commonly known as:  GLUCOPHAGE Take 1,000 mg  by mouth daily.   metoprolol succinate 25 MG 24 hr tablet Commonly known as:  TOPROL-XL Take 1 tablet (25 mg total) by mouth daily. Start taking on:  November 25, 2018   Crestor 40mg  po q daily    Rapaflo 8 MG Caps capsule Generic drug:  silodosin Take 8 mg by mouth daily.   ticagrelor 90 MG Tabs tablet Commonly known as:  BRILINTA Take 1 tablet (90 mg total) by mouth 2 (two) times daily.      Follow-up Information    Elder NegusPatwardhan, Manish J, MD Follow up on 12/03/2018.   Specialties:  Cardiology, Radiology Why:  1:00 PM Contact information: [address] [place] [address] [phone]           Time coordinating discharge: 39 minutes  Signed:  Chaske Paskett  [facility] 11/24/2018, 10:21 AM

## 2018-11-24 NOTE — Care Management (Signed)
11-24-18 1053 CM did place a page to MD regarding Brilinta- to send Rx to [facility]. Medication should be delivered to bedside. No further needs from CM at this time. Bethena Roys, RN,BSN Case Manager [phone]

## 2018-12-03 ENCOUNTER — Ambulatory Visit (INDEPENDENT_AMBULATORY_CARE_PROVIDER_SITE_OTHER): Payer: PRIVATE HEALTH INSURANCE | Admitting: Cardiology

## 2018-12-03 ENCOUNTER — Other Ambulatory Visit: Payer: Self-pay

## 2018-12-03 ENCOUNTER — Encounter: Payer: Self-pay | Admitting: Cardiology

## 2018-12-03 VITALS — BP 127/75 | HR 73 | Temp 97.8°F | Ht 67.0 in | Wt 251.0 lb

## 2018-12-03 DIAGNOSIS — I5022 Chronic systolic (congestive) heart failure: Secondary | ICD-10-CM | POA: Diagnosis not present

## 2018-12-03 DIAGNOSIS — I2510 Atherosclerotic heart disease of native coronary artery without angina pectoris: Secondary | ICD-10-CM | POA: Diagnosis not present

## 2018-12-03 DIAGNOSIS — I255 Ischemic cardiomyopathy: Secondary | ICD-10-CM

## 2018-12-03 DIAGNOSIS — I10 Essential (primary) hypertension: Secondary | ICD-10-CM | POA: Diagnosis not present

## 2018-12-03 DIAGNOSIS — E119 Type 2 diabetes mellitus without complications: Secondary | ICD-10-CM | POA: Diagnosis not present

## 2018-12-03 MED ORDER — JARDIANCE 10 MG PO TABS: 10.0000 mg | ORAL_TABLET | Freq: Every day | ORAL | 3 refills | Status: DC

## 2018-12-03 NOTE — Progress Notes (Signed)
Follow up visit  Subjective:   Colin Weaver, male    DOB: [birth year], 68 y.o.   MRN: [id number]   Chief Complaint  Patient presents with  . Hypertension    post hospital  . New Patient (Initial Visit)  . NSTEMI      HPI  36 y/oAfrican Americanmalewith hypertension,hyperlipidemia,type 2 DM, BPH, CAD s/p NSTEMI 11/2018, pLAD 95% stenosis, treated with successful PTCA and stent placement Synergy 3.0 X 20 mm drug-eluting stent. 0% residual stenosis, but distal thromboembolization required aggrastat infusion. Patient was also found to have ischemic cardiomyopathy with EF 30-35%. Patient was discharged on Aspirin/Brilinta/metoprolol succinate 25 mg/amlodipine 5 mg/crestor 40 mg. He is here for follow up.   Patient is doing very well since his discharge.  He has not had any episodes of chest pain or shortness of breath.  He is walking for 20 minutes every day and is looking forward to increasing his activity.  He asks me today if he can resume sexual activity.  Past Medical History:  Diagnosis Date  . Diabetes mellitus without complication ([place])   . Hypertension   . Renal disorder      Past Surgical History:  Procedure Laterality Date  . CORONARY STENT INTERVENTION N/A 11/23/2018   Procedure: CORONARY STENT INTERVENTION;  Surgeon: Nigel Mormon, MD;  Location: [place] [facility];  Service: Cardiovascular;  Laterality: N/A;  . CYSTOSCOPY    . LEFT HEART CATH AND CORONARY ANGIOGRAPHY N/A 11/23/2018   Procedure: LEFT HEART CATH AND CORONARY ANGIOGRAPHY;  Surgeon: Nigel Mormon, MD;  Location: [place] [facility];  Service: Cardiovascular;  Laterality: N/A;     Social History   Socioeconomic History  . Marital status: Married    Spouse name: Not on file  . Number of children: Not on file  . Years of education: Not on file  . Highest education level: Not on file  Occupational History  . Not on file  Social Needs  . Financial resource strain: Not on file  .  Food insecurity    Worry: Not on file    Inability: Not on file  . Transportation needs    Medical: Not on file    Non-medical: Not on file  Tobacco Use  . Smoking status: Never Smoker  . Smokeless tobacco: Never Used  Substance and Sexual Activity  . Alcohol use: No    Alcohol/week: 0.0 standard drinks  . Drug use: No  . Sexual activity: Not on file  Lifestyle  . Physical activity    Days per week: Not on file    Minutes per session: Not on file  . Stress: Not on file  Relationships  . Social Herbalist on phone: Not on file    Gets together: Not on file    Attends religious service: Not on file    Active member of club or organization: Not on file    Attends meetings of clubs or organizations: Not on file    Relationship status: Not on file  . Intimate partner violence    Fear of current or ex partner: Not on file    Emotionally abused: Not on file    Physically abused: Not on file    Forced sexual activity: Not on file  Other Topics Concern  . Not on file  Social History Narrative  . Not on file    Family History  Problem Relation Age of Onset  . Diabetes Mother  Current Outpatient Medications on File Prior to Visit  Medication Sig Dispense Refill  . amLODipine (NORVASC) 5 MG tablet Take 5 mg by mouth daily.    . ANDROGEL PUMP 20.25 MG/ACT (1.62%) GEL Apply 1 application topically See admin instructions. Apply to each shoulder daily as directed    . aspirin EC 81 MG tablet Take 81 mg by mouth daily.    Marland Kitchen. losartan (COZAAR) 50 MG tablet Take 50 mg by mouth every evening.     . metFORMIN (GLUCOPHAGE) 500 MG tablet Take 1,000 mg by mouth daily.     . metoprolol succinate (TOPROL-XL) 25 MG 24 hr tablet Take 1 tablet (25 mg total) by mouth daily. 30 tablet 3  . RAPAFLO 8 MG CAPS capsule Take 8 mg by mouth daily.    . rosuvastatin (CRESTOR) 40 MG tablet Take 1 tablet (40 mg total) by mouth at bedtime. 30 tablet 11  . ticagrelor (BRILINTA) 90 MG TABS  tablet Take 1 tablet (90 mg total) by mouth 2 (two) times daily. 60 tablet 11   No current facility-administered medications on file prior to visit.     Cardiovascular studies:  EKG 12/03/2018: Sinus rhythm 66 bpm First degree A-V block  Left bundle branch block.   Coronary angiography 11/23/2018: LM: Normal LAD: Mid 95% stenosis LCx: OM1 ostial 40%, mid LCx 40% stenosis. RCA: Normal with minimal luminal irregularities.  Successful percutaneous coronary intervention mid LAD PTCA and stent placement Synergy 3.0 X 20 mm drug-eluting stent Post intervention TIMI flow II likely due to distal thromboembolization. Aggrastat bolus and infusion started.  Echocardiogram 11/22/2018: 1. Severe hypokinesis of the left ventricular, entire apical segment. 2. The left ventricle has moderate-severely reduced systolic function, with an ejection fraction of 30-35%. The cavity size was normal. There is mildly increased left ventricular wall thickness. Left ventricular diastolic Doppler parameters are  consistent with impaired relaxation. Left ventricular diffuse hypokinesis. 3. The right ventricle has normal systolic function. The cavity was normal. There is no increase in right ventricular wall thickness. 4. There is mild mitral annular calcification present. 5. The aortic valve is tricuspid.   Echocardiogram2017: - Left ventricle: Systolic function was mildly reduced. The estimated ejection fraction was in the range of 45% to 50%. Akinesis of the apical myocardium. Doppler parameters are consistent with abnormal left ventricular relaxation (grade 1 diastolic dysfunction). - Aortic valve: There was trivial regurgitation. - Left atrium: The atrium was mildly dilated.  Recent labs:      Review of Systems  Constitution: Negative for decreased appetite, malaise/fatigue, weight gain and weight loss.  HENT: Negative for congestion.   Eyes: Negative for visual  disturbance.  Cardiovascular: Negative for chest pain, dyspnea on exertion, leg swelling, palpitations and syncope.  Respiratory: Negative for cough.   Endocrine: Negative for cold intolerance.  Hematologic/Lymphatic: Does not bruise/bleed easily.  Skin: Negative for itching and rash.  Musculoskeletal: Negative for myalgias.  Gastrointestinal: Negative for abdominal pain, nausea and vomiting.  Genitourinary: Negative for dysuria.  Neurological: Negative for dizziness and weakness.  Psychiatric/Behavioral: The patient is not nervous/anxious.   All other systems reviewed and are negative.        Vitals:   12/03/18 1259  BP: 127/75  Pulse: 73  Temp: 97.8 F (36.6 C)  SpO2: 96%    Body mass index is 39.31 kg/m. Filed Weights   12/03/18 1259  Weight: 251 lb (113.9 kg)     Objective:   Physical Exam  Constitutional: He is oriented to person,  place, and time. He appears well-developed and well-nourished. No distress.  HENT:  Head: Normocephalic and atraumatic.  Eyes: Pupils are equal, round, and reactive to light. Conjunctivae are normal.  Neck: No JVD present.  Cardiovascular: Normal rate, regular rhythm and intact distal pulses.  Pulmonary/Chest: Effort normal and breath sounds normal. He has no wheezes. He has no rales.  Abdominal: Soft. Bowel sounds are normal. There is no rebound.  Musculoskeletal:        General: No edema.  Lymphadenopathy:    He has no cervical adenopathy.  Neurological: He is alert and oriented to person, place, and time. No cranial nerve deficit.  Skin: Skin is warm and dry.  Psychiatric: He has a normal mood and affect.  Nursing note and vitals reviewed.       Assessment & Recommendations:   68 y.o.African Americanmalewith hypertension,hyperlipidemia,type 2 DM, BPH, NSTEMI 11/2018, mid LAD PCI, mildly reduced EF.  CAD without angina: NSTEMI 11/2018 Mid LAD 95% stenosis, treated with successful PTCA and stent placement Synergy 3.0  X 20 mm drug-eluting stent. 0% residual stenosis, but distal thromboembolization requiring aggrastat infusion. Chest pain free. Tolerating increasing physical activity. Okay to resume sexual activity. Referred to cardiac rehab. Continue Aspirin/Brilinta, metoprolol succinate 25 mg, amlodipine 5 mg, losartan 50 mg, crestor 40 mg.  HFrEF: New diagnosis. Ischemic cardiomyopathy. Expect this to improve. If not, may need Entresto. Continue above medications for now.  Will check echocardiogram in 6 weeks.  Palpitations: No recurrence.   Type 2 DM: Added Jardiance.  Follow up after echocardiorgram.   Elder NegusManish J Leatha Rohner, MD [facility] Pager: [phone] Office: [phone] If no answer Cell [phone]

## 2018-12-05 ENCOUNTER — Encounter: Payer: Self-pay | Admitting: Cardiology

## 2018-12-05 DIAGNOSIS — I255 Ischemic cardiomyopathy: Secondary | ICD-10-CM | POA: Insufficient documentation

## 2018-12-05 DIAGNOSIS — I2510 Atherosclerotic heart disease of native coronary artery without angina pectoris: Secondary | ICD-10-CM | POA: Insufficient documentation

## 2018-12-07 ENCOUNTER — Telehealth (HOSPITAL_COMMUNITY): Payer: Self-pay

## 2019-01-15 ENCOUNTER — Ambulatory Visit (INDEPENDENT_AMBULATORY_CARE_PROVIDER_SITE_OTHER): Payer: PRIVATE HEALTH INSURANCE

## 2019-01-15 ENCOUNTER — Other Ambulatory Visit: Payer: Self-pay

## 2019-01-15 DIAGNOSIS — I255 Ischemic cardiomyopathy: Secondary | ICD-10-CM | POA: Diagnosis not present

## 2019-01-15 DIAGNOSIS — I5022 Chronic systolic (congestive) heart failure: Secondary | ICD-10-CM | POA: Diagnosis not present

## 2019-01-22 ENCOUNTER — Encounter: Payer: Self-pay | Admitting: Cardiology

## 2019-01-22 ENCOUNTER — Other Ambulatory Visit: Payer: Self-pay

## 2019-01-22 ENCOUNTER — Ambulatory Visit (INDEPENDENT_AMBULATORY_CARE_PROVIDER_SITE_OTHER): Payer: PRIVATE HEALTH INSURANCE | Admitting: Cardiology

## 2019-01-22 VITALS — BP 131/78 | HR 60 | Ht 67.0 in | Wt 246.0 lb

## 2019-01-22 DIAGNOSIS — I255 Ischemic cardiomyopathy: Secondary | ICD-10-CM

## 2019-01-22 DIAGNOSIS — I2510 Atherosclerotic heart disease of native coronary artery without angina pectoris: Secondary | ICD-10-CM

## 2019-01-22 DIAGNOSIS — I5022 Chronic systolic (congestive) heart failure: Secondary | ICD-10-CM | POA: Diagnosis not present

## 2019-01-22 DIAGNOSIS — I10 Essential (primary) hypertension: Secondary | ICD-10-CM

## 2019-01-22 DIAGNOSIS — E119 Type 2 diabetes mellitus without complications: Secondary | ICD-10-CM

## 2019-01-22 NOTE — Progress Notes (Signed)
Follow up visit  Subjective:   Colin Weaver, male    DOB: [birth year], 68 y.o.   MRN: [id number]   Chief Complaint  Patient presents with  . Coronary Artery Disease    f/u tests      HPI  68 y.o.African Americanmalewith hypertension,hyperlipidemia,type 2 DM, BPH, NSTEMI 11/2018, mid LAD PCI, mildly reduced EF.  I personally reviewed his echocardiogram. There is mild improvement in LVEF, but remains 35-40%. Historically, his echocardiogram in 2017 also showed EF around 45%.  He denies chest pain, shortness of breath, palpitations, leg edema, orthopnea, PND, TIA/syncope. He has been walking up to 1.5 miles, which includes two hills, 3-5 times a week without any symptoms. He has noticed increased urination since starting Jardiance, which is ecpected.    Past Medical History:  Diagnosis Date  . Diabetes mellitus without complication ([place])   . Hypertension   . NSTEMI (non-ST elevated myocardial infarction) ([place])   . Renal disorder      Past Surgical History:  Procedure Laterality Date  . CARDIAC CATHETERIZATION    . CORONARY ANGIOPLASTY    . CORONARY STENT INTERVENTION N/A 11/23/2018   Procedure: CORONARY STENT INTERVENTION;  Surgeon: Nigel Mormon, MD;  Location: [facility];  Service: Cardiovascular;  Laterality: N/A;  . CYSTOSCOPY    . LEFT HEART CATH AND CORONARY ANGIOGRAPHY N/A 11/23/2018   Procedure: LEFT HEART CATH AND CORONARY ANGIOGRAPHY;  Surgeon: Nigel Mormon, MD;  Location: [facility];  Service: Cardiovascular;  Laterality: N/A;     Social History   Socioeconomic History  . Marital status: Married    Spouse name: Not on file  . Number of children: Not on file  . Years of education: Not on file  . Highest education level: Not on file  Occupational History  . Not on file  Social Needs  . Financial resource strain: Not on file  . Food insecurity    Worry: Not on file    Inability: Not on file  . Transportation needs   Medical: Not on file    Non-medical: Not on file  Tobacco Use  . Smoking status: Never Smoker  . Smokeless tobacco: Never Used  Substance and Sexual Activity  . Alcohol use: No    Alcohol/week: 0.0 standard drinks  . Drug use: No  . Sexual activity: Not on file  Lifestyle  . Physical activity    Days per week: Not on file    Minutes per session: Not on file  . Stress: Not on file  Relationships  . Social Herbalist on phone: Not on file    Gets together: Not on file    Attends religious service: Not on file    Active member of club or organization: Not on file    Attends meetings of clubs or organizations: Not on file    Relationship status: Not on file  . Intimate partner violence    Fear of current or ex partner: Not on file    Emotionally abused: Not on file    Physically abused: Not on file    Forced sexual activity: Not on file  Other Topics Concern  . Not on file  Social History Narrative  . Not on file    Family History  Problem Relation Age of Onset  . Diabetes Mother   . Heart failure Mother   . Heart disease Father   . Diabetes Sister   . Heart attack Brother   .  Diabetes Sister      Current Outpatient Medications on File Prior to Visit  Medication Sig Dispense Refill  . amLODipine (NORVASC) 5 MG tablet Take 5 mg by mouth daily.    . ANDROGEL PUMP 20.25 MG/ACT (1.62%) GEL Apply 1 application topically See admin instructions. Apply to each shoulder daily as directed    . aspirin EC 81 MG tablet Take 81 mg by mouth daily.    . empagliflozin (JARDIANCE) 10 MG TABS tablet Take 10 mg by mouth daily. 30 tablet 3  . losartan (COZAAR) 50 MG tablet Take 50 mg by mouth every evening.     . metFORMIN (GLUCOPHAGE) 500 MG tablet Take 1,000 mg by mouth daily.     . metoprolol succinate (TOPROL-XL) 25 MG 24 hr tablet Take 1 tablet (25 mg total) by mouth daily. 30 tablet 3  . RAPAFLO 8 MG CAPS capsule Take 8 mg by mouth daily.    . rosuvastatin (CRESTOR) 40  MG tablet Take 1 tablet (40 mg total) by mouth at bedtime. 30 tablet 11  . ticagrelor (BRILINTA) 90 MG TABS tablet Take 1 tablet (90 mg total) by mouth 2 (two) times daily. 60 tablet 11   No current facility-administered medications on file prior to visit.     Cardiovascular studies:  Echocardiogram 01/15/2019 :  1. Left ventricle cavity is normal in size. Severe concentric hypertrophy of the left ventricle. Hypokinetic global wall motion. Abnormal septal wall motion due to left bundle branch block. Moderately depressed LV systolic function with visual EF 35-40%. Doppler evidence of grade I (impaired) diastolic dysfunction, normal LAP. Calculated EF 50%. 2. Left atrial cavity is moderately dilated at 4.1 cm, appears larger in apical views. 3. Trileaflet aortic valve. Mild (Grade I) aortic regurgitation. 4. Native mitral valve. Mild (Grade I) mitral regurgitation. Mild calcification of the mitral valve annulus. 5. Structurally normal tricuspid valve. Mild tricuspid regurgitation. No evidence of pulmonary hypertension. 6. Compared to the study done on 11/22/2018, EF is improved from 30-35%.  EKG 12/03/2018: Sinus rhythm 66 bpm First degree A-V block  Left bundle branch block.   Coronary angiography 11/23/2018: LM: Normal LAD: Mid 95% stenosis LCx: OM1 ostial 40%, mid LCx 40% stenosis. RCA: Normal with minimal luminal irregularities.  Successful percutaneous coronary intervention mid LAD PTCA and stent placement Synergy 3.0 X 20 mm drug-eluting stent Post intervention TIMI flow II likely due to distal thromboembolization. Aggrastat bolus and infusion started.  Echocardiogram 11/22/2018: 1. Severe hypokinesis of the left ventricular, entire apical segment. 2. The left ventricle has moderate-severely reduced systolic function, with an ejection fraction of 30-35%. The cavity size was normal. There is mildly increased left ventricular wall thickness. Left ventricular diastolic  Doppler parameters are  consistent with impaired relaxation. Left ventricular diffuse hypokinesis. 3. The right ventricle has normal systolic function. The cavity was normal. There is no increase in right ventricular wall thickness. 4. There is mild mitral annular calcification present. 5. The aortic valve is tricuspid.   Echocardiogram2017: - Left ventricle: Systolic function was mildly reduced. The estimated ejection fraction was in the range of 45% to 50%. Akinesis of the apical myocardium. Doppler parameters are consistent with abnormal left ventricular relaxation (grade 1 diastolic dysfunction). - Aortic valve: There was trivial regurgitation. - Left atrium: The atrium was mildly dilated.  Recent labs: Results for Frutoso ChaseGOLSON, Mirza E (MRN [id number]) as of 01/22/2019 10:40  Ref. Range 11/24/2018 07:14  BASIC METABOLIC PANEL Unknown Rpt (A)  Sodium Latest Ref Range: 135 - 145  mmol/L 139  Potassium Latest Ref Range: 3.5 - 5.1 mmol/L 4.0  Chloride Latest Ref Range: 98 - 111 mmol/L 105  CO2 Latest Ref Range: 22 - 32 mmol/L 24  Glucose Latest Ref Range: 70 - 99 mg/dL 409122 (H)  BUN Latest Ref Range: 8 - 23 mg/dL 9  Creatinine Latest Ref Range: 0.61 - 1.24 mg/dL 8.111.06  Calcium Latest Ref Range: 8.9 - 10.3 mg/dL 9.3  Anion gap Latest Ref Range: 5 - 15  10  Magnesium Latest Ref Range: 1.7 - 2.4 mg/dL 1.9  GFR, Est Non African American Latest Ref Range: >60 mL/min >60  GFR, Est African American Latest Ref Range: >60 mL/min >60   Results for Frutoso ChaseGOLSON, Helio E (MRN [id number]) as of 01/22/2019 10:40  Ref. Range 11/24/2018 07:14  WBC Latest Ref Range: 4.0 - 10.5 K/uL 7.9  RBC Latest Ref Range: 4.22 - 5.81 MIL/uL 5.00  Hemoglobin Latest Ref Range: 13.0 - 17.0 g/dL 21.314.2  HCT Latest Ref Range: 39.0 - 52.0 % 43.2  MCV Latest Ref Range: 80.0 - 100.0 fL 86.4  MCH Latest Ref Range: 26.0 - 34.0 pg 28.4  MCHC Latest Ref Range: 30.0 - 36.0 g/dL 08.632.9  RDW Latest Ref Range: 11.5 - 15.5 % 12.7   Platelets Latest Ref Range: 150 - 400 K/uL 242  nRBC Latest Ref Range: 0.0 - 0.2 % 0.0    Results for Frutoso ChaseGOLSON, Nysir E (MRN [id number]) as of 01/22/2019 10:40  Ref. Range 11/22/2018 05:04  Total CHOL/HDL Ratio Latest Units: RATIO 3.0  Cholesterol Latest Ref Range: 0 - 200 mg/dL 528103  HDL Cholesterol Latest Ref Range: >40 mg/dL 34 (L)  LDL (calc) Latest Ref Range: 0 - 99 mg/dL 58  Triglycerides Latest Ref Range: <150 mg/dL 55  VLDL Latest Ref Range: 0 - 40 mg/dL 11      Review of Systems  Constitution: Negative for decreased appetite, malaise/fatigue, weight gain and weight loss.  HENT: Negative for congestion.   Eyes: Negative for visual disturbance.  Cardiovascular: Negative for chest pain, dyspnea on exertion, leg swelling, palpitations and syncope.  Respiratory: Negative for cough.   Endocrine: Negative for cold intolerance.  Hematologic/Lymphatic: Does not bruise/bleed easily.  Skin: Negative for itching and rash.  Musculoskeletal: Negative for myalgias.  Gastrointestinal: Negative for abdominal pain, nausea and vomiting.  Genitourinary: Negative for dysuria.  Neurological: Negative for dizziness and weakness.  Psychiatric/Behavioral: The patient is not nervous/anxious.   All other systems reviewed and are negative.        Vitals:   01/22/19 1035  BP: 131/78  Pulse: 60  SpO2: 98%    Body mass index is 38.53 kg/m. Filed Weights   01/22/19 1035  Weight: 246 lb (111.6 kg)     Objective:   Physical Exam  Constitutional: He is oriented to person, place, and time. He appears well-developed and well-nourished. No distress.  HENT:  Head: Normocephalic and atraumatic.  Eyes: Pupils are equal, round, and reactive to light. Conjunctivae are normal.  Neck: No JVD present.  Cardiovascular: Normal rate, regular rhythm and intact distal pulses.  Pulmonary/Chest: Effort normal and breath sounds normal. He has no wheezes. He has no rales.  Abdominal: Soft. Bowel sounds are  normal. There is no rebound.  Musculoskeletal:        General: No edema.  Lymphadenopathy:    He has no cervical adenopathy.  Neurological: He is alert and oriented to person, place, and time. No cranial nerve deficit.  Skin: Skin is warm and  dry.  Psychiatric: He has a normal mood and affect.  Nursing note and vitals reviewed.       Assessment & Recommendations:   68 y.o.African Americanmalewith hypertension,hyperlipidemia,type 2 DM, BPH, NSTEMI 11/2018, mid LAD PCI, mildly reduced EF.  CAD without angina: NSTEMI 11/2018 Mid LAD 95% stenosis, treated with successful PTCA and stent placement Synergy 3.0 X 20 mm drug-eluting stent. 0% residual stenosis, but distal thromboembolization requiring aggrastat infusion. Continue Aspirin/Brilinta (till 11/2019), metoprolol succinate 25 mg, amlodipine 5 mg, losartan 50 mg, crestor 40 mg. Continue regular exercise.   HFrEF: Ischemic cardiomyopathy with mild improvement in LVEF. Clinically, no heart failure symptoms. Continue losartan 50 mg daily, metoprolol succinate 25 mg daily,   Type 2 DM: Continue Jardiance.  Follow up in 6 months.  Elder NegusManish J Canna Nickelson, MD [facility] Pager: [phone] Office: [phone] If no answer Cell [phone]

## 2019-03-23 ENCOUNTER — Other Ambulatory Visit: Payer: Self-pay

## 2019-03-23 DIAGNOSIS — E119 Type 2 diabetes mellitus without complications: Secondary | ICD-10-CM

## 2019-03-23 DIAGNOSIS — I255 Ischemic cardiomyopathy: Secondary | ICD-10-CM

## 2019-03-23 DIAGNOSIS — I214 Non-ST elevation (NSTEMI) myocardial infarction: Secondary | ICD-10-CM

## 2019-03-23 MED ORDER — JARDIANCE 10 MG PO TABS: 10.0000 mg | ORAL_TABLET | Freq: Every day | ORAL | 3 refills | Status: DC

## 2019-03-23 MED ORDER — ROSUVASTATIN CALCIUM 40 MG PO TABS: 40.0000 mg | ORAL_TABLET | Freq: Every day | ORAL | 11 refills | Status: DC

## 2019-03-23 MED ORDER — TICAGRELOR 90 MG PO TABS: 90.0000 mg | ORAL_TABLET | Freq: Two times a day (BID) | ORAL | 11 refills | Status: DC

## 2019-04-06 ENCOUNTER — Other Ambulatory Visit: Payer: Self-pay | Admitting: Cardiology

## 2019-04-06 DIAGNOSIS — I255 Ischemic cardiomyopathy: Secondary | ICD-10-CM

## 2019-04-06 DIAGNOSIS — E119 Type 2 diabetes mellitus without complications: Secondary | ICD-10-CM

## 2019-04-20 ENCOUNTER — Encounter (HOSPITAL_COMMUNITY): Payer: Self-pay

## 2019-04-20 ENCOUNTER — Other Ambulatory Visit: Payer: Self-pay

## 2019-04-20 ENCOUNTER — Emergency Department (HOSPITAL_COMMUNITY)
Admission: EM | Admit: 2019-04-20 | Discharge: 2019-04-21 | Disposition: A | Discharge status: Home / Self Care | Payer: 59 | Attending: Emergency Medicine | Admitting: Emergency Medicine

## 2019-04-20 DIAGNOSIS — M549 Dorsalgia, unspecified: Secondary | ICD-10-CM | POA: Diagnosis present

## 2019-04-20 DIAGNOSIS — M899 Disorder of bone, unspecified: Secondary | ICD-10-CM

## 2019-04-20 DIAGNOSIS — M545 Low back pain, unspecified: Secondary | ICD-10-CM

## 2019-04-20 DIAGNOSIS — N182 Chronic kidney disease, stage 2 (mild): Secondary | ICD-10-CM | POA: Diagnosis not present

## 2019-04-20 DIAGNOSIS — Z79899 Other long term (current) drug therapy: Secondary | ICD-10-CM | POA: Diagnosis not present

## 2019-04-20 DIAGNOSIS — Z7982 Long term (current) use of aspirin: Secondary | ICD-10-CM | POA: Insufficient documentation

## 2019-04-20 DIAGNOSIS — I130 Hypertensive heart and chronic kidney disease with heart failure and stage 1 through stage 4 chronic kidney disease, or unspecified chronic kidney disease: Secondary | ICD-10-CM | POA: Insufficient documentation

## 2019-04-20 DIAGNOSIS — Z7984 Long term (current) use of oral hypoglycemic drugs: Secondary | ICD-10-CM | POA: Insufficient documentation

## 2019-04-20 DIAGNOSIS — E1122 Type 2 diabetes mellitus with diabetic chronic kidney disease: Secondary | ICD-10-CM | POA: Insufficient documentation

## 2019-04-20 DIAGNOSIS — I509 Heart failure, unspecified: Secondary | ICD-10-CM | POA: Insufficient documentation

## 2019-04-20 NOTE — ED Triage Notes (Signed)
Pt reports Right side back pain after lifting heavy furniture this evening

## 2019-04-21 ENCOUNTER — Emergency Department (HOSPITAL_COMMUNITY): Payer: 59

## 2019-04-21 LAB — CBC WITH DIFFERENTIAL/PLATELET
Abs Immature Granulocytes: 0.02 10*3/uL (ref 0.00–0.07)
Basophils Absolute: 0.1 10*3/uL (ref 0.0–0.1)
Basophils Relative: 1 %
Eosinophils Absolute: 0 10*3/uL (ref 0.0–0.5)
Eosinophils Relative: 1 %
HCT: 48.8 % (ref 39.0–52.0)
Hemoglobin: 15.5 g/dL (ref 13.0–17.0)
Immature Granulocytes: 0 %
Lymphocytes Relative: 38 %
Lymphs Abs: 2.8 10*3/uL (ref 0.7–4.0)
MCH: 28.5 pg (ref 26.0–34.0)
MCHC: 31.8 g/dL (ref 30.0–36.0)
MCV: 89.7 fL (ref 80.0–100.0)
Monocytes Absolute: 0.7 10*3/uL (ref 0.1–1.0)
Monocytes Relative: 9 %
Neutro Abs: 3.9 10*3/uL (ref 1.7–7.7)
Neutrophils Relative %: 51 %
Platelets: 276 10*3/uL (ref 150–400)
RBC: 5.44 MIL/uL (ref 4.22–5.81)
RDW: 13.8 % (ref 11.5–15.5)
WBC: 7.5 10*3/uL (ref 4.0–10.5)
nRBC: 0 % (ref 0.0–0.2)

## 2019-04-21 LAB — HEMOGLOBIN A1C
Hgb A1c MFr Bld: 8.5 % — ABNORMAL HIGH (ref 4.8–5.6)
Mean Plasma Glucose: 197.25 mg/dL

## 2019-04-21 LAB — COMPREHENSIVE METABOLIC PANEL
ALT: 30 U/L (ref 0–44)
AST: 22 U/L (ref 15–41)
Albumin: 4.2 g/dL (ref 3.5–5.0)
Alkaline Phosphatase: 47 U/L (ref 38–126)
Anion gap: 10 (ref 5–15)
BUN: 10 mg/dL (ref 8–23)
CO2: 25 mmol/L (ref 22–32)
Calcium: 9.6 mg/dL (ref 8.9–10.3)
Chloride: 107 mmol/L (ref 98–111)
Creatinine, Ser: 1.17 mg/dL (ref 0.61–1.24)
GFR calc Af Amer: >60 mL/min (ref >60)
GFR calc non Af Amer: >60 mL/min (ref >60)
Glucose, Bld: 147 mg/dL — ABNORMAL HIGH (ref 70–99)
Potassium: 4.4 mmol/L (ref 3.5–5.1)
Sodium: 142 mmol/L (ref 135–145)
Total Bilirubin: 0.6 mg/dL (ref 0.3–1.2)
Total Protein: 8 g/dL (ref 6.5–8.1)

## 2019-04-21 LAB — TSH: TSH: 1.262 u[IU]/mL (ref 0.350–4.500)

## 2019-04-21 MED ORDER — HYDROCODONE-ACETAMINOPHEN 5-325 MG PO TABS: 1.0000 | ORAL_TABLET | ORAL | 0 refills | Status: AC | PRN

## 2019-04-21 MED ORDER — MELOXICAM 7.5 MG PO TABS
15.0000 mg | ORAL_TABLET | Freq: Once | ORAL | Status: DC
  Filled 2019-04-21: qty 2

## 2019-04-21 MED ORDER — CYCLOBENZAPRINE HCL 10 MG PO TABS
10.0000 mg | ORAL_TABLET | Freq: Once | ORAL | Status: DC
  Filled 2019-04-21: qty 1

## 2019-04-21 NOTE — ED Notes (Signed)
Pt verbalized understanding of discharge paperwork and follow-up care.  °

## 2019-04-21 NOTE — ED Notes (Signed)
Patient transported to X-ray 

## 2019-04-21 NOTE — ED Provider Notes (Signed)
[facility] EM[facility]GENC[facility] DEPARTMENT Provider Note   CSN: [id number] Arrival date & time: 04/20/19  2134     History   Chief Complaint Chief Complaint  Patient presents with  . Back Pain    HPI Colin Weaver is a 68 y.o. male.     68 year old male with past medical history below including NSTEMI, sCHF, schema cardiomyopathy, hypertension, type 2 diabetes mellitus who presents with back pain.  Yesterday afternoon, he did some heavy lifting including lifting some furniture and later lifting his lawnmower.  He went upstairs to the shower and when he bent over, he had a sudden onset of right lumbar back pain that was so severe that he could not stand upright.  He crawled down the stairs and got to his wife who gave him a hydrocodone prior to bringing him to the ED for evaluation.  He waited in the waiting room for quite a long time, during which time his back pain improved because of the medication.  Currently, his back pain is minimal.  He denies any radiation of the pain down his legs, leg numbness/weakness, bowel/bladder incontinence, or saddle anesthesia.  No fevers or recent illness.  Denies any previous back injury or surgery.  The history is provided by the patient.  Back Pain   Past Medical History:  Diagnosis Date  . Diabetes mellitus without complication ([place])   . Hypertension   . NSTEMI (non-ST elevated myocardial infarction) ([place])   . Renal disorder     Patient Active Problem List   Diagnosis Date Noted  . Ischemic cardiomyopathy 12/05/2018  . Coronary artery disease involving native coronary artery of native heart without angina pectoris 12/05/2018  . Chronic systolic heart failure ([place]) 12/03/2018  . NSTEMI (non-ST elevated myocardial infarction) ([place]) 11/20/2018  . Hypertension 11/20/2018  . Type 2 diabetes mellitus without complication, without long-term current use of insulin ([place]) 11/20/2018  . Chronic renal insufficiency, stage 2 (mild)  11/20/2018  . Syncope 09/09/2015    Past Surgical History:  Procedure Laterality Date  . CARDIAC CATHETERIZATION    . CORONARY ANGIOPLASTY    . CORONARY STENT INTERVENTION N/A 11/23/2018   Procedure: CORONARY STENT INTERVENTION;  Surgeon: Nigel Mormon, MD;  Location: [facility];  Service: Cardiovascular;  Laterality: N/A;  . CYSTOSCOPY    . LEFT HEART CATH AND CORONARY ANGIOGRAPHY N/A 11/23/2018   Procedure: LEFT HEART CATH AND CORONARY ANGIOGRAPHY;  Surgeon: Nigel Mormon, MD;  Location: [facility];  Service: Cardiovascular;  Laterality: N/A;        Home Medications    Prior to Admission medications   Medication Sig Start Date End Date Taking? Authorizing Provider  amLODipine (NORVASC) 5 MG tablet Take 5 mg by mouth daily.    [provider]  ANDROGEL PUMP 20.25 MG/ACT (1.62%) GEL Apply 1 application topically See admin instructions. Apply to each shoulder daily as directed 09/08/15   [provider]  aspirin EC 81 MG tablet Take 81 mg by mouth daily.    [provider]  JARDIANCE 10 MG TABS tablet TAKE 1 TABLET BY MOUTH EVERY DAY 04/06/19   Patwardhan, Manish J, MD  losartan (COZAAR) 50 MG tablet Take 50 mg by mouth every evening.     [provider]  metFORMIN (GLUCOPHAGE) 500 MG tablet Take 1,000 mg by mouth daily.  08/25/15   [provider]  metoprolol succinate (TOPROL-XL) 25 MG 24 hr tablet Take 1 tablet (25 mg total)  by mouth daily. 11/25/18   Pokhrel, Laxman, MD  RAPAFLO 8 MG CAPS capsule Take 8 mg by mouth daily. 08/25/15   [provider]  rosuvastatin (CRESTOR) 40 MG tablet Take 1 tablet (40 mg total) by mouth at bedtime. 03/23/19 03/22/20  Patwardhan, Anabel Bene, MD  ticagrelor (BRILINTA) 90 MG TABS tablet Take 1 tablet (90 mg total) by mouth 2 (two) times daily. 03/23/19   Patwardhan, Anabel Bene, MD    Family History Family History  Problem Relation Age of Onset  . Diabetes Mother   . Heart  failure Mother   . Heart disease Father   . Diabetes Sister   . Heart attack Brother   . Diabetes Sister     Social History Social History   Tobacco Use  . Smoking status: Never Smoker  . Smokeless tobacco: Never Used  Substance Use Topics  . Alcohol use: No    Alcohol/week: 0.0 standard drinks  . Drug use: No     Allergies   Lisinopril   Review of Systems Review of Systems  Musculoskeletal: Positive for back pain.   All other systems reviewed and are negative except that which was mentioned in HPI   Physical Exam Updated Vital Signs BP 140/76 (BP Location: Right Arm)   Pulse (!) 57   Temp 97.6 F (36.4 C)   Resp 17   SpO2 98%   Physical Exam Vitals signs and nursing note reviewed.  Constitutional:      General: He is not in acute distress.    Appearance: He is well-developed.  HENT:     Head: Normocephalic and atraumatic.  Eyes:     Conjunctiva/sclera: Conjunctivae normal.  Neck:     Musculoskeletal: Neck supple.  Pulmonary:     Effort: Pulmonary effort is normal.  Musculoskeletal:     Comments: Mild tenderness R lumbar paraspinal muscles  Skin:    General: Skin is warm and dry.  Neurological:     Mental Status: He is alert and oriented to person, place, and time.     Sensory: No sensory deficit.     Motor: No weakness.     Gait: Gait normal.     Deep Tendon Reflexes: Reflexes normal.     Comments: Fluent speech, normal gait, 5/5 strength and normal sensation BLE  Psychiatric:        Judgment: Judgment normal.      ED Treatments / Results  Labs (all labs ordered are listed, but only abnormal results are displayed) Labs Reviewed - No data to display  EKG None  Radiology No results found.  Procedures Procedures (including critical care time)  Medications Ordered in ED Medications  meloxicam (MOBIC) tablet 15 mg (has no administration in time range)  cyclobenzaprine (FLEXERIL) tablet 10 mg (has no administration in time range)      Initial Impression / Assessment and Plan / ED Course  I have reviewed the triage vital signs and the nursing notes.  Pertinent imaging results that were available during my care of the patient were reviewed by me and considered in my medical decision making (see chart for details).       Very pleasant and well-appearing on exam, ambulatory.  Vital signs reassuring.  No infectious symptoms to suggest infectious cause of his back pain.  No neurologic deficits or other red flag symptoms to suggest cauda equina.  Because of his age and sudden onset of pain, obtained x-ray which was notable for sclerotic bone lesion at L4 vertebral  body.  Radiologist recommended MRI.  Discussed these recommendations with the patient who requested that MRI be done later since he had been in the ED for so long.  I contacted his [facility], who stated that he was supposed to have blood work drawn today before a routine appointment next week.  We agreed that he needed MRI and she will arrange for this as an outpatient.  I ordered screening lab work that she will follow up on so that he will be able to receive contrast for his MRI.  I have extensively reviewed return precautions with him regarding any neurologic deficits.  He voiced understanding.  Final Clinical Impressions(s) / ED Diagnoses   Final diagnoses:  Low back pain    ED Discharge Orders    None       Cameren Odwyer, Ambrose Finlandachel Morgan, MD 04/21/19 1149

## 2019-05-03 ENCOUNTER — Other Ambulatory Visit: Payer: Self-pay | Admitting: Family Medicine

## 2019-05-03 DIAGNOSIS — M545 Low back pain, unspecified: Secondary | ICD-10-CM

## 2019-05-03 DIAGNOSIS — M899 Disorder of bone, unspecified: Secondary | ICD-10-CM

## 2019-05-03 DIAGNOSIS — R9389 Abnormal findings on diagnostic imaging of other specified body structures: Secondary | ICD-10-CM

## 2019-06-07 ENCOUNTER — Ambulatory Visit
Admission: RE | Admit: 2019-06-07 | Discharge: 2019-06-07 | Disposition: A | Discharge status: Home / Self Care | Payer: 59 | Source: Ambulatory Visit | Attending: Family Medicine | Admitting: Family Medicine

## 2019-06-07 DIAGNOSIS — M899 Disorder of bone, unspecified: Secondary | ICD-10-CM

## 2019-06-07 DIAGNOSIS — R9389 Abnormal findings on diagnostic imaging of other specified body structures: Secondary | ICD-10-CM

## 2019-06-07 DIAGNOSIS — M545 Low back pain, unspecified: Secondary | ICD-10-CM

## 2019-06-07 MED ORDER — GADOBENATE DIMEGLUMINE 529 MG/ML IV SOLN
20.0000 mL | Freq: Once | INTRAVENOUS | Status: AC | PRN
  Administered 2019-06-07: 20 mL via INTRAVENOUS

## 2019-07-25 NOTE — Progress Notes (Signed)
Follow up visit  Subjective:   Colin Weaver, male    DOB: 08-20-1950, 69 y.o.   MRN: [id number]   Chief Complaint  Patient presents with  . Coronary Artery Disease  . Follow-up    6 month      HPI  69 y.o.African Americanmalewith hypertension,hyperlipidemia,type 2 DM, BPH, NSTEMI 11/2018, mid LAD PCI, mildly reduced EF.  I personally reviewed his echocardiogram. There is mild improvement in LVEF, but remains 35-40%. Historically, his echocardiogram in 2017 also showed EF around 45%.  He denies chest pain, shortness of breath, palpitations, leg edema, orthopnea, PND, TIA/syncope. He is walking 7000 steps daily, and hoping to reach 9000 steps daily. He has quit drinking soda pops. He recently had A1C check through his PCP. Results not available top me. He has f/u w/PCP later this week.   Current Outpatient Medications on File Prior to Visit  Medication Sig Dispense Refill  . amLODipine (NORVASC) 5 MG tablet Take 5 mg by mouth daily.    . ANDROGEL PUMP 20.25 MG/ACT (1.62%) GEL Apply 1 application topically See admin instructions. Apply to each shoulder daily as directed    . aspirin EC 81 MG tablet Take 81 mg by mouth daily.    Marland Kitchen HYDROcodone-acetaminophen (NORCO/VICODIN) 5-325 MG tablet Take 1 tablet by mouth every 4 (four) hours as needed. 8 tablet 0  . JARDIANCE 10 MG TABS tablet TAKE 1 TABLET BY MOUTH EVERY DAY 30 tablet 3  . losartan (COZAAR) 50 MG tablet Take 50 mg by mouth every evening.     . metFORMIN (GLUCOPHAGE) 500 MG tablet Take 1,000 mg by mouth daily.     . metoprolol succinate (TOPROL-XL) 25 MG 24 hr tablet Take 1 tablet (25 mg total) by mouth daily. 30 tablet 3  . RAPAFLO 8 MG CAPS capsule Take 8 mg by mouth daily.    . rosuvastatin (CRESTOR) 40 MG tablet Take 1 tablet (40 mg total) by mouth at bedtime. 30 tablet 11  . ticagrelor (BRILINTA) 90 MG TABS tablet Take 1 tablet (90 mg total) by mouth 2 (two) times daily. 180 tablet 11   No current  facility-administered medications on file prior to visit.    Cardiovascular studies:  EKG 07/26/2019: Sinus rhythm 60 bpm. First degree A-V block  Left bundle branch block.   Echocardiogram 01/15/2019 :  1. Left ventricle cavity is normal in size. Severe concentric hypertrophy of the left ventricle. Hypokinetic global wall motion. Abnormal septal wall motion due to left bundle branch block. Moderately depressed LV systolic function with visual EF 35-40%. Doppler evidence of grade I (impaired) diastolic dysfunction, normal LAP. Calculated EF 50%. 2. Left atrial cavity is moderately dilated at 4.1 cm, appears larger in apical views. 3. Trileaflet aortic valve. Mild (Grade I) aortic regurgitation. 4. Native mitral valve. Mild (Grade I) mitral regurgitation. Mild calcification of the mitral valve annulus. 5. Structurally normal tricuspid valve. Mild tricuspid regurgitation. No evidence of pulmonary hypertension. 6. Compared to the study done on 11/22/2018, EF is improved from 30-35%.  Coronary angiography/intervention 11/23/2018: LM: Normal LAD: Mid 95% stenosis LCx: OM1 ostial 40%, mid LCx 40% stenosis. RCA: Normal with minimal luminal irregularities.  Successful percutaneous coronary intervention mid LAD PTCA and stent placement Synergy 3.0 X 20 mm drug-eluting stent Post intervention TIMI flow II likely due to distal thromboembolization. Aggrastat bolus and infusion started.  Recent labs: 04/2019: Glucose 147, BUN/Cr 10/1.17. EGFR >60. Na/K 142/4.4. Rest of the CMP normal H/H 15/48. MCV 89. Platelets 276  HbA1C 8.5% TSH 1.2 normal  11/24/2018: Glucose 122, BUN/Cr 9/1.06. EGFR >60. Na/K 139/4.0. Rest of the CMP normal H/H 14/43. MCV 86. Platelets 242 A1C 7.6% Chol 103, TG 55, HDL 34, LDL 58   Review of Systems  Cardiovascular: Negative for chest pain, dyspnea on exertion, leg swelling, palpitations and syncope.         Vitals:   07/26/19 0930  BP: 122/70  Pulse: 68    Temp: 98.1 F (36.7 C)  SpO2: 96%     Body mass index is 38.22 kg/m. Filed Weights   07/26/19 0930  Weight: 244 lb (110.7 kg)     Objective:   Physical Exam  Constitutional: He appears well-developed and well-nourished.  Neck: No JVD present.  Cardiovascular: Normal rate, regular rhythm, normal heart sounds and intact distal pulses.  No murmur heard. Pulmonary/Chest: Effort normal and breath sounds normal. He has no wheezes. He has no rales.  Musculoskeletal:        General: No edema.  Nursing note and vitals reviewed.       Assessment & Recommendations:   69 y.o.African Americanmalewith hypertension,hyperlipidemia,type 2 DM, BPH, NSTEMI 11/2018, mid LAD PCI, mildly reduced EF.  CAD without angina: NSTEMI 11/2018 Mid LAD 95% stenosis, treated with successful PTCA and stent placement Synergy 3.0 X 20 mm drug-eluting stent. 0% residual stenosis, but distal thromboembolization requiring aggrastat infusion (11/2018). Continue Aspirin/Brilinta (till 11/2019), metoprolol succinate 25 mg, amlodipine 5 mg, losartan 50 mg, crestor 40 mg. Continue regular exercise.   HFrEF: Ischemic cardiomyopathy with mildly reduced LVEF. Clinically, no heart failure symptoms. Continue losartan 50 mg daily, metoprolol succinate 25 mg daily,  Will repeat echocardiogram.  Type 2 DM: Continue Jardiance. May increase, if necessary for DM control. Discuss with PCP.  Hypertension: Controlled. I suspect his constipation may be due to amlodipine. Discussed increasing water intake, high fiber diet.   Follow up in 6 months.  Nigel Mormon, MD [facility] Pager: [phone] Office: [phone] If no answer Cell [phone]

## 2019-07-26 ENCOUNTER — Encounter: Payer: Self-pay | Admitting: Cardiology

## 2019-07-26 ENCOUNTER — Other Ambulatory Visit: Payer: Self-pay

## 2019-07-26 ENCOUNTER — Ambulatory Visit (INDEPENDENT_AMBULATORY_CARE_PROVIDER_SITE_OTHER): Payer: PRIVATE HEALTH INSURANCE | Admitting: Cardiology

## 2019-07-26 VITALS — BP 122/70 | HR 68 | Temp 98.1°F | Ht 67.0 in | Wt 244.0 lb

## 2019-07-26 DIAGNOSIS — E119 Type 2 diabetes mellitus without complications: Secondary | ICD-10-CM

## 2019-07-26 DIAGNOSIS — I2510 Atherosclerotic heart disease of native coronary artery without angina pectoris: Secondary | ICD-10-CM | POA: Diagnosis not present

## 2019-07-26 DIAGNOSIS — I255 Ischemic cardiomyopathy: Secondary | ICD-10-CM | POA: Diagnosis not present

## 2019-07-26 DIAGNOSIS — I10 Essential (primary) hypertension: Secondary | ICD-10-CM | POA: Diagnosis not present

## 2019-07-26 DIAGNOSIS — I5022 Chronic systolic (congestive) heart failure: Secondary | ICD-10-CM

## 2019-07-28 ENCOUNTER — Other Ambulatory Visit: Payer: PRIVATE HEALTH INSURANCE

## 2019-08-02 ENCOUNTER — Other Ambulatory Visit: Payer: Self-pay

## 2019-08-02 ENCOUNTER — Ambulatory Visit: Payer: PRIVATE HEALTH INSURANCE

## 2019-08-02 DIAGNOSIS — I255 Ischemic cardiomyopathy: Secondary | ICD-10-CM

## 2019-08-09 ENCOUNTER — Other Ambulatory Visit: Payer: Self-pay | Admitting: Cardiology

## 2019-08-09 ENCOUNTER — Telehealth: Payer: Self-pay | Admitting: Cardiology

## 2019-08-09 DIAGNOSIS — I5022 Chronic systolic (congestive) heart failure: Secondary | ICD-10-CM

## 2019-08-09 DIAGNOSIS — I255 Ischemic cardiomyopathy: Secondary | ICD-10-CM

## 2019-08-09 NOTE — Telephone Encounter (Signed)
I initially planned on switching his losartan to [facility].  However, it appears that he has cough reaction from lisinopril, and may not be able to tolerate Entresto.  He does not need Entresto samples.  He should stay on losartan.  Also does not need follow-up visit in 2 weeks, as previously requested.

## 2019-08-10 NOTE — Progress Notes (Signed)
Please see separate message on same pt. Will not be switching to [facility]. No labs needed. Keep 3/12 OV.

## 2019-08-27 ENCOUNTER — Other Ambulatory Visit: Payer: Self-pay

## 2019-08-27 ENCOUNTER — Ambulatory Visit: Payer: PRIVATE HEALTH INSURANCE | Admitting: Cardiology

## 2019-08-27 ENCOUNTER — Encounter: Payer: Self-pay | Admitting: Cardiology

## 2019-08-27 VITALS — BP 106/64 | HR 79 | Temp 97.3°F | Ht 67.0 in | Wt 249.0 lb

## 2019-08-27 DIAGNOSIS — I5022 Chronic systolic (congestive) heart failure: Secondary | ICD-10-CM

## 2019-08-27 DIAGNOSIS — I255 Ischemic cardiomyopathy: Secondary | ICD-10-CM

## 2019-08-27 DIAGNOSIS — I10 Essential (primary) hypertension: Secondary | ICD-10-CM

## 2019-08-27 DIAGNOSIS — E119 Type 2 diabetes mellitus without complications: Secondary | ICD-10-CM

## 2019-08-27 DIAGNOSIS — I2510 Atherosclerotic heart disease of native coronary artery without angina pectoris: Secondary | ICD-10-CM

## 2019-08-27 MED ORDER — METOPROLOL SUCCINATE ER 25 MG PO TB24: 25.0000 mg | ORAL_TABLET | Freq: Every day | ORAL | 3 refills | Status: DC

## 2019-08-27 NOTE — Progress Notes (Signed)
Follow up visit  Subjective:   Colin Weaver, male    DOB: 06-Sep-1950, 69 y.o.   MRN: [id number]   Chief Complaint  Patient presents with  . Coronary Artery Disease  . Follow-up      HPI  69 y.o.African Americanmalewith hypertension,hyperlipidemia,type 2 DM, BPH, NSTEMI 11/2018, mid LAD PCI, mildly reduced EF.  Recent echocardiogram showed EF remaining around 35%. Could not start Entresto due to his h/o cough with lisinopril. It appears that he was not taking metoprolol succinate.   He is walking 10,000 steps without any chest pain, shortness of breath. Also denies orthopnea, PND, leg edema.   Current Outpatient Medications on File Prior to Visit  Medication Sig Dispense Refill  . amLODipine (NORVASC) 5 MG tablet Take 5 mg by mouth daily.    . ANDROGEL PUMP 20.25 MG/ACT (1.62%) GEL Apply 1 application topically See admin instructions. Apply to each shoulder daily as directed    . aspirin EC 81 MG tablet Take 81 mg by mouth daily.    Marland Kitchen HYDROcodone-acetaminophen (NORCO/VICODIN) 5-325 MG tablet Take 1 tablet by mouth every 4 (four) hours as needed. 8 tablet 0  . JARDIANCE 10 MG TABS tablet TAKE 1 TABLET BY MOUTH EVERY DAY 30 tablet 3  . losartan (COZAAR) 50 MG tablet Take 50 mg by mouth every evening.     . metFORMIN (GLUCOPHAGE) 500 MG tablet Take 1,000 mg by mouth 2 (two) times daily with a meal.     . metoprolol succinate (TOPROL-XL) 25 MG 24 hr tablet Take 1 tablet (25 mg total) by mouth daily. 30 tablet 3  . RAPAFLO 8 MG CAPS capsule Take 8 mg by mouth daily.    . rosuvastatin (CRESTOR) 40 MG tablet Take 1 tablet (40 mg total) by mouth at bedtime. 30 tablet 11  . ticagrelor (BRILINTA) 90 MG TABS tablet Take 1 tablet (90 mg total) by mouth 2 (two) times daily. 180 tablet 11  . vardenafil (LEVITRA) 20 MG tablet as needed.     No current facility-administered medications on file prior to visit.    Cardiovascular studies:  Echocardiogram 08/02/2019:  Moderately  depressed LV systolic function with visual EF 35-40%. Left  ventricle cavity is normal in size. Severe concentric hypertrophy of the  left ventricle. Doppler evidence of grade I (impaired) diastolic  dysfunction, elevated LAP. Left ventricle regional wall motion findings:  Basal anteroseptal, Basal inferoseptal, Mid anteroseptal, Mid  inferoseptal, Apical anterior, Apical septal and Apical cap hypokinesis.  Calculated EF 40%.  No evidence of aortic stenosis. Mild (Grade I) aortic regurgitation.  Trace tricuspid regurgitation.  Prior study dated 01/15/2019, noted LVEF of 35-40% with regional wall  motion abnormalities, mild AR, mild MR, mild PR.   EKG 07/26/2019: Sinus rhythm 60 bpm. First degree A-V block  Left bundle branch block.   Coronary angiography/intervention 11/23/2018: LM: Normal LAD: Mid 95% stenosis LCx: OM1 ostial 40%, mid LCx 40% stenosis. RCA: Normal with minimal luminal irregularities.  Successful percutaneous coronary intervention mid LAD PTCA and stent placement Synergy 3.0 X 20 mm drug-eluting stent Post intervention TIMI flow II likely due to distal thromboembolization. Aggrastat bolus and infusion started.  Recent labs: 04/2019: Glucose 147, BUN/Cr 10/1.17. EGFR >60. Na/K 142/4.4. Rest of the CMP normal H/H 15/48. MCV 89. Platelets 276 HbA1C 8.5% TSH 1.2 normal  11/24/2018: Glucose 122, BUN/Cr 9/1.06. EGFR >60. Na/K 139/4.0. Rest of the CMP normal H/H 14/43. MCV 86. Platelets 242 A1C 7.6% Chol 103, TG 55, HDL 34, LDL  58   Review of Systems  Cardiovascular: Negative for chest pain, dyspnea on exertion, leg swelling, palpitations and syncope.         Vitals:   08/27/19 1409  BP: 106/64  Pulse: 79  Temp: (!) 97.3 F (36.3 C)  SpO2: 96%     Body mass index is 39 kg/m. Filed Weights   08/27/19 1409  Weight: 249 lb (112.9 kg)     Objective:   Physical Exam  Constitutional: He appears well-developed and well-nourished.  Neck: No JVD  present.  Cardiovascular: Normal rate, regular rhythm, normal heart sounds and intact distal pulses.  No murmur heard. Pulmonary/Chest: Effort normal and breath sounds normal. He has no wheezes. He has no rales.  Musculoskeletal:        General: No edema.  Nursing note and vitals reviewed.       Assessment & Recommendations:   69 y.o.African Americanmalewith hypertension,hyperlipidemia,type 2 DM, BPH, NSTEMI 11/2018, mid LAD PCI, mildly reduced EF.  CAD without angina: NSTEMI 11/2018 Mid LAD 95% stenosis, treated with successful PTCA and stent placement Synergy 3.0 X 20 mm drug-eluting stent. 0% residual stenosis, but distal thromboembolization requiring aggrastat infusion (11/2018). Continue Aspirin/Brilinta (till 11/2019), metoprolol succinate (see below), losartan 50 mg, crestor 40 mg. Continue regular exercise.   HFrEF: Ischemic cardiomyopathy with mildly reduced LVEF 35-40%. Clinically, no heart failure symptoms. Unable to use Entresto due to h/o cough with lisinopril. Continue losartan 50 mg daily. Resume metoprolol succinate 25 mg daily, stop amlodipine 5 mg daily.  Although EF remains low, he is clinically compensated.   Type 2 DM: Currently on Jardiance. Given HFrEF, could consider switching to [place], Defer to PCP.  Hypertension: Controlled.  Follow up in 3 months.  Nigel Mormon, MD [facility] Pager: [phone] Office: [phone] If no answer Cell [phone]

## 2019-09-15 ENCOUNTER — Other Ambulatory Visit: Payer: Self-pay | Admitting: Cardiology

## 2019-09-15 DIAGNOSIS — I255 Ischemic cardiomyopathy: Secondary | ICD-10-CM

## 2019-09-15 DIAGNOSIS — E119 Type 2 diabetes mellitus without complications: Secondary | ICD-10-CM

## 2020-01-24 ENCOUNTER — Other Ambulatory Visit: Payer: Self-pay

## 2020-01-24 ENCOUNTER — Encounter: Payer: Self-pay | Admitting: Cardiology

## 2020-01-24 ENCOUNTER — Ambulatory Visit (INDEPENDENT_AMBULATORY_CARE_PROVIDER_SITE_OTHER): Payer: PRIVATE HEALTH INSURANCE | Admitting: Cardiology

## 2020-01-24 VITALS — BP 131/84 | HR 70 | Resp 17 | Ht 67.0 in | Wt 239.0 lb

## 2020-01-24 DIAGNOSIS — E119 Type 2 diabetes mellitus without complications: Secondary | ICD-10-CM

## 2020-01-24 DIAGNOSIS — I5022 Chronic systolic (congestive) heart failure: Secondary | ICD-10-CM

## 2020-01-24 DIAGNOSIS — I10 Essential (primary) hypertension: Secondary | ICD-10-CM

## 2020-01-24 DIAGNOSIS — I255 Ischemic cardiomyopathy: Secondary | ICD-10-CM

## 2020-01-24 DIAGNOSIS — I2510 Atherosclerotic heart disease of native coronary artery without angina pectoris: Secondary | ICD-10-CM

## 2020-01-24 NOTE — Progress Notes (Signed)
Follow up visit  Subjective:   Colin Weaver, male    DOB: 1950-12-10, 69 y.o.   MRN: [id number]   Chief Complaint  Patient presents with  . Coronary Artery Disease  . Follow-up    6 month  . Cardiomyopathy      HPI  69 y.o.African Americanmalewith hypertension,hyperlipidemia,type 2 DM, BPH, NSTEMI 11/2018, mid LAD PCI, mildly reduced EF.  Echocardiogram in 07/2019 showed EF remaining around 35-40%. Could not start Entresto due to his h/o cough with lisinopril.   He is walking 10,000 steps without any chest pain, shortness of breath. Also denies orthopnea, PND, leg edema. He occasionally reports lightheadedness symptoms, when he walks long distance without drinking any water. He has f/uw/PCP soon where A1C will be checked.   Current Outpatient Medications on File Prior to Visit  Medication Sig Dispense Refill  . ANDROGEL PUMP 20.25 MG/ACT (1.62%) GEL Apply 1 application topically See admin instructions. Apply to each shoulder daily as directed    . aspirin EC 81 MG tablet Take 81 mg by mouth daily.    Marland Kitchen HYDROcodone-acetaminophen (NORCO/VICODIN) 5-325 MG tablet Take 1 tablet by mouth every 4 (four) hours as needed. 8 tablet 0  . ibuprofen (ADVIL) 800 MG tablet Take 800 mg by mouth every 8 (eight) hours as needed.    Marland Kitchen JARDIANCE 10 MG TABS tablet TAKE 1 TABLET DAILY 30 tablet 6  . losartan (COZAAR) 50 MG tablet Take 50 mg by mouth every evening.     . metFORMIN (GLUCOPHAGE) 500 MG tablet Take 1,000 mg by mouth 2 (two) times daily with a meal.     . metoprolol succinate (TOPROL-XL) 25 MG 24 hr tablet Take 1 tablet (25 mg total) by mouth daily. 30 tablet 3  . RAPAFLO 8 MG CAPS capsule Take 8 mg by mouth daily.    . rosuvastatin (CRESTOR) 40 MG tablet Take 1 tablet (40 mg total) by mouth at bedtime. 30 tablet 11  . ticagrelor (BRILINTA) 90 MG TABS tablet Take 1 tablet (90 mg total) by mouth 2 (two) times daily. 180 tablet 11  . vardenafil (LEVITRA) 20 MG tablet as needed.      No current facility-administered medications on file prior to visit.    Cardiovascular studies:  EKG 01/24/2020: Sinus rhythm 72 bpm First degree A-V block  Left bundle branch block  Echocardiogram 08/02/2019:  Moderately depressed LV systolic function with visual EF 35-40%. Left  ventricle cavity is normal in size. Severe concentric hypertrophy of the  left ventricle. Doppler evidence of grade I (impaired) diastolic  dysfunction, elevated LAP. Left ventricle regional wall motion findings:  Basal anteroseptal, Basal inferoseptal, Mid anteroseptal, Mid  inferoseptal, Apical anterior, Apical septal and Apical cap hypokinesis.  Calculated EF 40%.  No evidence of aortic stenosis. Mild (Grade I) aortic regurgitation.  Trace tricuspid regurgitation.  Prior study dated 01/15/2019, noted LVEF of 35-40% with regional wall  motion abnormalities, mild AR, mild MR, mild PR.   Coronary angiography/intervention 11/23/2018: LM: Normal LAD: Mid 95% stenosis LCx: OM1 ostial 40%, mid LCx 40% stenosis. RCA: Normal with minimal luminal irregularities. Successful percutaneous coronary intervention mid LAD PTCA and stent placement Synergy 3.0 X 20 mm drug-eluting stent Post intervention TIMI flow II likely due to distal thromboembolization. Aggrastat bolus and infusion started.  Recent labs: 10/18/2019: Glucose 147, BUN/Cr 10/1.1. EGFR 63 Chol 89, TG 63, HDL 29, LDL 46 TSH 1.2 normal  04/2019: Glucose 147, BUN/Cr 10/1.17. EGFR >60. Na/K 142/4.4. Rest of the CMP  normal H/H 15/48. MCV 89. Platelets 276 HbA1C 8.5% TSH 1.2 normal  11/24/2018: Glucose 122, BUN/Cr 9/1.06. EGFR >60. Na/K 139/4.0. Rest of the CMP normal H/H 14/43. MCV 86. Platelets 242 A1C 7.6% Chol 103, TG 55, HDL 34, LDL 58   Review of Systems  Cardiovascular: Negative for chest pain, dyspnea on exertion, leg swelling, palpitations and syncope.         Vitals:   01/24/20 1024  BP: 131/84  Pulse: 70  Resp: 17   SpO2: 97%     Body mass index is 37.43 kg/m. Filed Weights   01/24/20 1024  Weight: 239 lb (108.4 kg)     Objective:   Physical Exam Vitals and nursing note reviewed.  Constitutional:      Appearance: He is well-developed.  Neck:     Vascular: No JVD.  Cardiovascular:     Rate and Rhythm: Normal rate and regular rhythm.     Pulses: Intact distal pulses.     Heart sounds: Normal heart sounds. No murmur heard.   Pulmonary:     Effort: Pulmonary effort is normal.     Breath sounds: Normal breath sounds. No wheezing or rales.         Assessment & Recommendations:   69 y.o.African Americanmalewith hypertension,hyperlipidemia,type 2 DM, BPH, NSTEMI 11/2018, mid LAD PCI, mildly reduced EF.  CAD without angina: NSTEMI 11/2018 Mid LAD 95% stenosis, treated with successful PTCA and stent placement Synergy 3.0 X 20 mm drug-eluting stent. 0% residual stenosis, but distal thromboembolization requiring aggrastat infusion (11/2018). Continue Aspirin/Brilinta (till 11/2019), metoprolol succinate (see below), losartan 50 mg, crestor 40 mg. Continue regular exercise.  Recommend liberal hydration, especially while on Jardiance.  HFrEF: Ischemic cardiomyopathy with mildly reduced LVEF 35-40%. Clinically, no heart failure symptoms. Unable to use Entresto due to h/o cough with lisinopril. Currently on  losartan 50 mg daily, metoprolol succinate 25 mg daily Although EF remains low, he is clinically compensated.   Type 2 DM: Currently on Jardiance. Given HFrEF, could consider switching to [place], Defer to PCP.  Hypertension: Controlled.  F/u in 6 months  Novalee Horsfall Esther Hardy, MD [facility] Pager: [phone] Office: [phone] If no answer Cell [phone]

## 2020-03-08 ENCOUNTER — Telehealth: Payer: Self-pay

## 2020-03-08 NOTE — Telephone Encounter (Signed)
Telephone encounter:  Reason for call: pt called and wanted to know if he can reduce or stop jadiance because it makes him urinate to much.   Usual provider: MP  Last office visit: 01/24/20  Next office visit: 07/26/20   Last hospitalization: 04/20/19  Current Outpatient Medications on File Prior to Visit  Medication Sig Dispense Refill  . ANDROGEL PUMP 20.25 MG/ACT (1.62%) GEL Apply 1 application topically See admin instructions. Apply to each shoulder daily as directed    . aspirin EC 81 MG tablet Take 81 mg by mouth daily.    Marland Kitchen HYDROcodone-acetaminophen (NORCO/VICODIN) 5-325 MG tablet Take 1 tablet by mouth every 4 (four) hours as needed. (Patient not taking: Reported on 01/24/2020) 8 tablet 0  . JARDIANCE 10 MG TABS tablet TAKE 1 TABLET DAILY 30 tablet 6  . losartan (COZAAR) 50 MG tablet Take 50 mg by mouth every evening.     . metFORMIN (GLUCOPHAGE) 500 MG tablet Take 1,000 mg by mouth 2 (two) times daily with a meal.     . metoprolol succinate (TOPROL-XL) 25 MG 24 hr tablet Take 1 tablet (25 mg total) by mouth daily. 30 tablet 3  . RAPAFLO 8 MG CAPS capsule Take 8 mg by mouth daily.    . rosuvastatin (CRESTOR) 40 MG tablet Take 1 tablet (40 mg total) by mouth at bedtime. 30 tablet 11  . vardenafil (LEVITRA) 20 MG tablet as needed. (Patient not taking: Reported on 01/24/2020)     No current facility-administered medications on file prior to visit.

## 2020-03-08 NOTE — Telephone Encounter (Signed)
Okay with me, but should also check with his diabetes doctor. May need changes in other medications to control diabetes.  Thanks MJP

## 2020-03-26 ENCOUNTER — Other Ambulatory Visit: Payer: Self-pay | Admitting: Cardiology

## 2020-03-26 DIAGNOSIS — I214 Non-ST elevation (NSTEMI) myocardial infarction: Secondary | ICD-10-CM

## 2020-07-26 ENCOUNTER — Ambulatory Visit: Payer: PRIVATE HEALTH INSURANCE | Admitting: Cardiology

## 2020-08-03 ENCOUNTER — Ambulatory Visit: Payer: PRIVATE HEALTH INSURANCE | Admitting: Cardiology

## 2020-08-03 ENCOUNTER — Other Ambulatory Visit: Payer: Self-pay

## 2020-08-03 ENCOUNTER — Encounter: Payer: Self-pay | Admitting: Cardiology

## 2020-08-03 VITALS — BP 158/86 | HR 63 | Temp 97.7°F | Resp 16 | Ht 67.0 in | Wt 252.8 lb

## 2020-08-03 DIAGNOSIS — I255 Ischemic cardiomyopathy: Secondary | ICD-10-CM

## 2020-08-03 DIAGNOSIS — I5022 Chronic systolic (congestive) heart failure: Secondary | ICD-10-CM

## 2020-08-03 DIAGNOSIS — I10 Essential (primary) hypertension: Secondary | ICD-10-CM

## 2020-08-03 DIAGNOSIS — E119 Type 2 diabetes mellitus without complications: Secondary | ICD-10-CM

## 2020-08-03 NOTE — Progress Notes (Signed)
Follow up visit  Subjective:   Colin Weaver, male    DOB: 1951/05/13, 70 y.o.   MRN: [id number]   Chief Complaint  Patient presents with  . Coronary Artery Disease  . Cardiomyopathy  . Follow-up    6 month      HPI  70 y.o.African Americanmalewith hypertension,hyperlipidemia,type 2 DM, BPH, NSTEMI 11/2018, mid LAD PCI, mildly reduced EF.  Echocardiogram in 07/2019 showed EF remaining around 35-40%. Could not start Entresto due to his h/o cough with lisinopril.  It looks 2 months with complaints of chest pain shortness.  Also denies any orthopnea, leg edema, PND.  His diabetes is uncontrolled.  He could not tolerate Jardiance due to frequent urination.  He has close follow-up with his endocrinologist, next in March 2022.  Blood pressure elevated today. He does not check it regularly at home, but states that it is always better controlled at his endocrinologist visits.   Current Outpatient Medications on File Prior to Visit  Medication Sig Dispense Refill  . ANDROGEL PUMP 20.25 MG/ACT (1.62%) GEL Apply 1 application topically See admin instructions. Apply to each shoulder daily as directed    . aspirin EC 81 MG tablet Take 81 mg by mouth daily.    Marland Kitchen HYDROcodone-acetaminophen (NORCO/VICODIN) 5-325 MG tablet Take 1 tablet by mouth every 4 (four) hours as needed. 8 tablet 0  . losartan (COZAAR) 50 MG tablet Take 50 mg by mouth every evening.     . metFORMIN (GLUCOPHAGE) 500 MG tablet Take 1,000 mg by mouth 2 (two) times daily with a meal.     . metoprolol succinate (TOPROL-XL) 25 MG 24 hr tablet Take 1 tablet (25 mg total) by mouth daily. 30 tablet 3  . RAPAFLO 8 MG CAPS capsule Take 8 mg by mouth daily.    . rosuvastatin (CRESTOR) 40 MG tablet Take 1 tablet (40 mg total) by mouth at bedtime. 30 tablet 11   No current facility-administered medications on file prior to visit.    Cardiovascular studies:  EKG 08/03/2020: Sinus rhythm 60 bpm First degree A-V block  Left  bundle branch block QRS 168 msec  Echocardiogram 08/02/2019:  Moderately depressed LV systolic function with visual EF 35-40%. Left  ventricle cavity is normal in size. Severe concentric hypertrophy of the  left ventricle. Doppler evidence of grade I (impaired) diastolic  dysfunction, elevated LAP. Left ventricle regional wall motion findings:  Basal anteroseptal, Basal inferoseptal, Mid anteroseptal, Mid  inferoseptal, Apical anterior, Apical septal and Apical cap hypokinesis.  Calculated EF 40%.  No evidence of aortic stenosis. Mild (Grade I) aortic regurgitation.  Trace tricuspid regurgitation.  Prior study dated 01/15/2019, noted LVEF of 35-40% with regional wall  motion abnormalities, mild AR, mild MR, mild PR.   Coronary angiography/intervention 11/23/2018: LM: Normal LAD: Mid 95% stenosis LCx: OM1 ostial 40%, mid LCx 40% stenosis. RCA: Normal with minimal luminal irregularities. Successful percutaneous coronary intervention mid LAD PTCA and stent placement Synergy 3.0 X 20 mm drug-eluting stent Post intervention TIMI flow II likely due to distal thromboembolization. Aggrastat bolus and infusion started.  Recent labs: 10/18/2019: Glucose 147, BUN/Cr 10/1.1. EGFR 63 Chol 89, TG 63, HDL 29, LDL 46 TSH 1.2 normal  04/2019: Glucose 147, BUN/Cr 10/1.17. EGFR >60. Na/K 142/4.4. Rest of the CMP normal H/H 15/48. MCV 89. Platelets 276 HbA1C 8.5% TSH 1.2 normal  11/24/2018: Glucose 122, BUN/Cr 9/1.06. EGFR >60. Na/K 139/4.0. Rest of the CMP normal H/H 14/43. MCV 86. Platelets 242 A1C 7.6% Chol 103,  TG 55, HDL 34, LDL 58   Review of Systems  Cardiovascular: Negative for chest pain, dyspnea on exertion, leg swelling, palpitations and syncope.         Vitals:   08/03/20 1003  BP: (!) 158/86  Pulse: 63  Resp: 16  Temp: 97.7 F (36.5 C)  SpO2: 96%     Body mass index is 39.59 kg/m. Filed Weights   08/03/20 1003  Weight: 252 lb 12.8 oz (114.7 kg)      Objective:   Physical Exam Vitals and nursing note reviewed.  Constitutional:      Appearance: He is well-developed.  Neck:     Vascular: No JVD.  Cardiovascular:     Rate and Rhythm: Normal rate and regular rhythm.     Pulses: Intact distal pulses.     Heart sounds: Normal heart sounds. No murmur heard.   Pulmonary:     Effort: Pulmonary effort is normal.     Breath sounds: Normal breath sounds. No wheezing or rales.         Assessment & Recommendations:   70 y.o.African Americanmalewith hypertension,hyperlipidemia,type 2 DM, BPH, NSTEMI 11/2018, mid LAD PCI, mildly reduced EF.   CAD without angina: NSTEMI 11/2018 Mid LAD 95% stenosis, treated with successful PTCA and stent placement Synergy 3.0 X 20 mm drug-eluting stent. 0% residual stenosis, but distal thromboembolization requiring aggrastat infusion (11/2018). Continue Aspirin, metoprolol succinate 25 mg, losartan 50 mg, crestor 40 mg. Continue regular exercise.   HFrEF: Ischemic cardiomyopathy with mildly reduced LVEF 35-40%. Clinically, no heart failure symptoms. Unable to use Entresto due to h/o cough with lisinopril. Currently on  losartan 50 mg daily, metoprolol succinate 25 mg daily Although EF remains low, he is clinically compensated.   Type 2 DM: Consider Jardiance/Farxiga F/u w/endocrinology  Hypertension: Uncontrolled. Encouraged regular home checks  F/u in 4-6 with BO monitor and log  Isabele Lollar Esther Hardy, MD [facility] [place] Pager: [phone] Office: [phone] If no answer Cell [phone]

## 2020-09-13 NOTE — Progress Notes (Signed)
Follow up visit  Subjective:   Colin Weaver, male    DOB: [birth year], 70 y.o.   MRN: [id number]   Chief Complaint  Patient presents with  . Hypertension  . Follow-up      HPI  70 y.o.African Americanmalewith hypertension,hyperlipidemia,type 2 DM, BPH, NSTEMI 11/2018, mid LAD PCI, mildly reduced EF.  Patient denies any chest pain or shortness of breath.  He works out regularly including 40 minutes walking without any symptoms.  His blood pressure remains elevated.  It appears that he is not taking metoprolol succinate previously prescribed to him.  His lipid panel is also unfavorable.  However, he only started taking Crestor a few days ago.  He tells me that his recent A1c, checked by his endocrinologist was 7.1%.  I do not have these labs available to me.  Current Outpatient Medications on File Prior to Visit  Medication Sig Dispense Refill  . ANDROGEL PUMP 20.25 MG/ACT (1.62%) GEL Apply 1 application topically See admin instructions. Apply to each shoulder daily as directed    . aspirin EC 81 MG tablet Take 81 mg by mouth daily.    Marland Kitchen HYDROcodone-acetaminophen (NORCO/VICODIN) 5-325 MG tablet Take 1 tablet by mouth every 4 (four) hours as needed. 8 tablet 0  . losartan (COZAAR) 50 MG tablet Take 50 mg by mouth every evening.     . metFORMIN (GLUCOPHAGE) 500 MG tablet Take 1,000 mg by mouth 2 (two) times daily with a meal.     . RAPAFLO 8 MG CAPS capsule Take 8 mg by mouth daily.    . rosuvastatin (CRESTOR) 40 MG tablet Take 1 tablet (40 mg total) by mouth at bedtime. 30 tablet 11  . metoprolol succinate (TOPROL-XL) 25 MG 24 hr tablet Take 1 tablet (25 mg total) by mouth daily. (Patient not taking: Reported on 09/14/2020) 30 tablet 3   No current facility-administered medications on file prior to visit.    Cardiovascular studies:   EKG 08/03/2020: Sinus rhythm 60 bpm First degree A-V block  Left bundle branch block QRS 168 msec  Echocardiogram 08/02/2019:  Moderately  depressed LV systolic function with visual EF 35-40%. Left  ventricle cavity is normal in size. Severe concentric hypertrophy of the  left ventricle. Doppler evidence of grade I (impaired) diastolic  dysfunction, elevated LAP. Left ventricle regional wall motion findings:  Basal anteroseptal, Basal inferoseptal, Mid anteroseptal, Mid  inferoseptal, Apical anterior, Apical septal and Apical cap hypokinesis.  Calculated EF 40%.  No evidence of aortic stenosis. Mild (Grade I) aortic regurgitation.  Trace tricuspid regurgitation.  Prior study dated 01/15/2019, noted LVEF of 35-40% with regional wall  motion abnormalities, mild AR, mild MR, mild PR.   Coronary angiography/intervention 11/23/2018: LM: Normal LAD: Mid 95% stenosis LCx: OM1 ostial 40%, mid LCx 40% stenosis. RCA: Normal with minimal luminal irregularities. Successful percutaneous coronary intervention mid LAD PTCA and stent placement Synergy 3.0 X 20 mm drug-eluting stent Post intervention TIMI flow II likely due to distal thromboembolization. Aggrastat bolus and infusion started.  Recent labs: 08/23/2020: Glucose 147, BUN/Cr 10/1.1. EGFR 78. HbA1C 8.5% Chol 164, TG 89, HDL 35, LDL 112 TSH 1.4 normal  10/18/2019: Glucose 147, BUN/Cr 10/1.1. EGFR 63 Chol 89, TG 63, HDL 29, LDL 46 TSH 1.2 normal  04/2019: Glucose 147, BUN/Cr 10/1.17. EGFR >60. Na/K 142/4.4. Rest of the CMP normal H/H 15/48. MCV 89. Platelets 276 HbA1C 8.5% TSH 1.2 normal  11/24/2018: Glucose 122, BUN/Cr 9/1.06. EGFR >60. Na/K 139/4.0. Rest of the CMP normal  H/H 14/43. MCV 86. Platelets 242 A1C 7.6% Chol 103, TG 55, HDL 34, LDL 58   Review of Systems  Cardiovascular: Negative for chest pain, dyspnea on exertion, leg swelling, palpitations and syncope.         Vitals:   09/14/20 0926  BP: (!) 162/87  Pulse: 69  Temp: 97.7 F (36.5 C)  SpO2: 97%     Body mass index is 39.16 kg/m. Filed Weights   09/14/20 0926  Weight: 250 lb (113.4  kg)     Objective:   Physical Exam Vitals and nursing note reviewed.  Constitutional:      Appearance: He is well-developed.  Neck:     Vascular: No JVD.  Cardiovascular:     Rate and Rhythm: Normal rate and regular rhythm.     Pulses: Intact distal pulses.     Heart sounds: Normal heart sounds. No murmur heard.   Pulmonary:     Effort: Pulmonary effort is normal.     Breath sounds: Normal breath sounds. No wheezing or rales.         Assessment & Recommendations:   70 y.o.African Americanmalewith hypertension,hyperlipidemia,type 2 DM, BPH, NSTEMI 11/2018, mid LAD PCI, mildly reduced EF.  CAD without angina: NSTEMI 11/2018 Mid LAD 95% stenosis, treated with successful PTCA and stent placement Synergy 3.0 X 20 mm drug-eluting stent. 0% residual stenosis, but distal thromboembolization requiring aggrastat infusion (11/2018). Continue Aspirin, metoprolol succinate 25 mg, losartan 50 mg. See below regarding lipid management.   Continue regular exercise.   Mixed hyperlipidemia: LDL 112.  He has been noncompliant with Crestor 20 mg.  Reemphasized compliance.   Repeat lipid panel in 1 month  HFrEF: Ischemic cardiomyopathy with mildly reduced LVEF 35-40%. Clinically, no heart failure symptoms. Unable to use Entresto due to h/o cough with lisinopril. Currently on  losartan 50 mg daily, metoprolol succinate 25 mg daily Although EF remains low, he is clinically compensated.   Type 2 DM: A1C is reportedly down to 7.1%.  Continue f/u w/endocrinology   Hypertension: Uncontrolled.  Resume metoprolol succinate at 50 mg aily.  Continue losartan 50 mg daily. If remains uncontrolled, could add amlodipine.   F/u in 6 weeks  Manish Esther Hardy, MD [facility] [place] Pager: [phone] Office: [phone] If no answer Cell [phone]

## 2020-09-14 ENCOUNTER — Encounter: Payer: Self-pay | Admitting: Cardiology

## 2020-09-14 ENCOUNTER — Other Ambulatory Visit: Payer: Self-pay

## 2020-09-14 ENCOUNTER — Ambulatory Visit: Payer: PRIVATE HEALTH INSURANCE | Admitting: Cardiology

## 2020-09-14 VITALS — BP 162/87 | HR 69 | Temp 97.7°F | Ht 67.0 in | Wt 250.0 lb

## 2020-09-14 DIAGNOSIS — I2510 Atherosclerotic heart disease of native coronary artery without angina pectoris: Secondary | ICD-10-CM

## 2020-09-14 DIAGNOSIS — I255 Ischemic cardiomyopathy: Secondary | ICD-10-CM

## 2020-09-14 DIAGNOSIS — I5022 Chronic systolic (congestive) heart failure: Secondary | ICD-10-CM

## 2020-09-14 DIAGNOSIS — E119 Type 2 diabetes mellitus without complications: Secondary | ICD-10-CM

## 2020-09-14 DIAGNOSIS — I214 Non-ST elevation (NSTEMI) myocardial infarction: Secondary | ICD-10-CM

## 2020-09-14 DIAGNOSIS — I10 Essential (primary) hypertension: Secondary | ICD-10-CM

## 2020-09-14 DIAGNOSIS — E782 Mixed hyperlipidemia: Secondary | ICD-10-CM

## 2020-09-14 MED ORDER — METOPROLOL SUCCINATE ER 50 MG PO TB24: 50.0000 mg | ORAL_TABLET | Freq: Every day | ORAL | 3 refills | Status: DC

## 2020-09-14 MED ORDER — ROSUVASTATIN CALCIUM 40 MG PO TABS: 40.0000 mg | ORAL_TABLET | Freq: Every day | ORAL | 3 refills | Status: DC

## 2020-10-26 ENCOUNTER — Ambulatory Visit: Payer: PRIVATE HEALTH INSURANCE | Admitting: Cardiology

## 2020-10-26 ENCOUNTER — Encounter: Payer: Self-pay | Admitting: Cardiology

## 2020-10-26 ENCOUNTER — Other Ambulatory Visit: Payer: Self-pay

## 2020-10-26 VITALS — BP 176/87 | HR 64 | Temp 97.8°F | Resp 16 | Ht 67.0 in | Wt 250.0 lb

## 2020-10-26 DIAGNOSIS — E782 Mixed hyperlipidemia: Secondary | ICD-10-CM

## 2020-10-26 DIAGNOSIS — I2510 Atherosclerotic heart disease of native coronary artery without angina pectoris: Secondary | ICD-10-CM

## 2020-10-26 DIAGNOSIS — E119 Type 2 diabetes mellitus without complications: Secondary | ICD-10-CM

## 2020-10-26 DIAGNOSIS — I255 Ischemic cardiomyopathy: Secondary | ICD-10-CM

## 2020-10-26 DIAGNOSIS — I5022 Chronic systolic (congestive) heart failure: Secondary | ICD-10-CM

## 2020-10-26 DIAGNOSIS — I10 Essential (primary) hypertension: Secondary | ICD-10-CM

## 2020-10-26 MED ORDER — METOPROLOL SUCCINATE ER 100 MG PO TB24: 100.0000 mg | ORAL_TABLET | Freq: Every day | ORAL | 2 refills | Status: DC

## 2020-10-26 MED ORDER — LOSARTAN POTASSIUM 100 MG PO TABS: 100.0000 mg | ORAL_TABLET | Freq: Every day | ORAL | 2 refills | Status: DC

## 2020-10-26 NOTE — Progress Notes (Signed)
Follow up visit  Subjective:   Colin Weaver, male    DOB: 1951/05/01, 70 y.o.   MRN: [id number]   Chief Complaint  Patient presents with  . Cardiomyopathy  . Coronary Artery Disease  . Hypertension  . Follow-up    6 week      HPI  70 y.o.African Americanmalewith hypertension,hyperlipidemia,type 2 DM, BPH, NSTEMI 11/2018, mid LAD PCI, mildly reduced EF.  Patient denies any chest pain or shortness of breath. Blood pressure remains elevated. He is compliant with his medical therapy.   Current Outpatient Medications on File Prior to Visit  Medication Sig Dispense Refill  . ANDROGEL PUMP 20.25 MG/ACT (1.62%) GEL Apply 1 application topically See admin instructions. Apply to each shoulder daily as directed    . aspirin EC 81 MG tablet Take 81 mg by mouth daily.    Marland Kitchen HYDROcodone-acetaminophen (NORCO/VICODIN) 5-325 MG tablet Take 1 tablet by mouth every 4 (four) hours as needed. 8 tablet 0  . losartan (COZAAR) 50 MG tablet Take 50 mg by mouth every evening.     . metFORMIN (GLUCOPHAGE) 500 MG tablet Take 1,000 mg by mouth 2 (two) times daily with a meal.     . metoprolol succinate (TOPROL-XL) 50 MG 24 hr tablet Take 1 tablet (50 mg total) by mouth daily. 90 tablet 3  . RAPAFLO 8 MG CAPS capsule Take 8 mg by mouth daily.    . rosuvastatin (CRESTOR) 40 MG tablet Take 1 tablet (40 mg total) by mouth at bedtime. 90 tablet 3   No current facility-administered medications on file prior to visit.    Cardiovascular studies:   EKG 08/03/2020: Sinus rhythm 60 bpm First degree A-V block  Left bundle branch block QRS 168 msec  Echocardiogram 08/02/2019:  Moderately depressed LV systolic function with visual EF 35-40%. Left  ventricle cavity is normal in size. Severe concentric hypertrophy of the  left ventricle. Doppler evidence of grade I (impaired) diastolic  dysfunction, elevated LAP. Left ventricle regional wall motion findings:  Basal anteroseptal, Basal inferoseptal, Mid  anteroseptal, Mid  inferoseptal, Apical anterior, Apical septal and Apical cap hypokinesis.  Calculated EF 40%.  No evidence of aortic stenosis. Mild (Grade I) aortic regurgitation.  Trace tricuspid regurgitation.  Prior study dated 01/15/2019, noted LVEF of 35-40% with regional wall  motion abnormalities, mild AR, mild MR, mild PR.   Coronary angiography/intervention 11/23/2018: LM: Normal LAD: Mid 95% stenosis LCx: OM1 ostial 40%, mid LCx 40% stenosis. RCA: Normal with minimal luminal irregularities. Successful percutaneous coronary intervention mid LAD PTCA and stent placement Synergy 3.0 X 20 mm drug-eluting stent Post intervention TIMI flow II likely due to distal thromboembolization. Aggrastat bolus and infusion started.  Recent labs: 08/23/2020: Glucose 147, BUN/Cr 10/1.1. EGFR 78. HbA1C 8.5% Chol 164, TG 89, HDL 35, LDL 112 TSH 1.4 normal  10/18/2019: Glucose 147, BUN/Cr 10/1.1. EGFR 63 Chol 89, TG 63, HDL 29, LDL 46 TSH 1.2 normal  04/2019: Glucose 147, BUN/Cr 10/1.17. EGFR >60. Na/K 142/4.4. Rest of the CMP normal H/H 15/48. MCV 89. Platelets 276 HbA1C 8.5% TSH 1.2 normal  11/24/2018: Glucose 122, BUN/Cr 9/1.06. EGFR >60. Na/K 139/4.0. Rest of the CMP normal H/H 14/43. MCV 86. Platelets 242 A1C 7.6% Chol 103, TG 55, HDL 34, LDL 58   Review of Systems  Cardiovascular: Negative for chest pain, dyspnea on exertion, leg swelling, palpitations and syncope.         Vitals:   10/26/20 1328  BP: (!) 176/87  Pulse: 64  Resp: 16  Temp: 97.8 F (36.6 C)  SpO2: 96%     Body mass index is 39.16 kg/m. Filed Weights   10/26/20 1328  Weight: 250 lb (113.4 kg)     Objective:   Physical Exam Vitals and nursing note reviewed.  Constitutional:      Appearance: He is well-developed.  Neck:     Vascular: No JVD.  Cardiovascular:     Rate and Rhythm: Normal rate and regular rhythm.     Pulses: Intact distal pulses.     Heart sounds: Normal heart sounds.  No murmur heard.   Pulmonary:     Effort: Pulmonary effort is normal.     Breath sounds: Normal breath sounds. No wheezing or rales.         Assessment & Recommendations:   70 y.o.African Americanmalewith hypertension,hyperlipidemia,type 2 DM, BPH, NSTEMI 11/2018, mid LAD PCI, mildly reduced EF.  Hypertension: Uncontrolled.  Increase losartan to 100 mg daily, metoprolol succinate to 100 mg daily. If remains uncontrolled, could add amlodipine.  Check BMP in 1 week  CAD without angina: NSTEMI 11/2018 Mid LAD 95% stenosis, treated with successful PTCA and stent placement Synergy 3.0 X 20 mm drug-eluting stent. 0% residual stenosis, but distal thromboembolization requiring aggrastat infusion (11/2018). Continue Aspirin, metoprolol succinate 25 mg, losartan 50 mg. See below regarding lipid management.   Continue regular exercise.   Mixed hyperlipidemia: LDL 112.  He had been noncompliant with Crestor 20 mg.  Reemphasized compliance.   Check lipid panel  HFrEF: Ischemic cardiomyopathy with mildly reduced LVEF 35-40%. Clinically, no heart failure symptoms. Unable to use Entresto due to h/o cough with lisinopril. Currently on  losartan and metoprolol succinate. Although EF remains low, he is clinically compensated.   Type 2 DM: A1C is reportedly down to 7.1%.  Continue f/u w/endocrinology  F/u in 4 weeks  Richy Spradley Esther Hardy, MD [facility] [place] Pager: [phone] Office: [phone] If no answer Cell [phone]

## 2020-10-29 IMAGING — MR MR LUMBAR SPINE WO/W CM
4 of 7 series · 14 of 48 positions shown · IV contrast (multihance)
Comparison: Lumbar radiographs 04/21/2019. CT Abdomen and Pelvis
08/22/2013.

CLINICAL DATA: 68-year-old male with chronic low back pain. Muscle
spasms. Nonspecific sclerotic L4 vertebral body lesion on lumbar
radiographs last month.

Creatinine was obtained on site at [HOSPITAL] at [HOSPITAL].
Results: Creatinine 1.1 mg/dL.
EXAM:
MRI LUMBAR SPINE WITHOUT AND WITH CONTRAST
TECHNIQUE: Multiplanar and multiecho pulse sequences of the lumbar spine were
obtained without and with intravenous contrast.
CONTRAST:  20mL MULTIHANCE GADOBENATE DIMEGLUMINE 529 MG/ML IV SOLN

[Series 6: T1 · sagittal · 4.0mm · 0.44mm/px · 3 of 13 slices shown (1 of 2)]
[im 1/13]
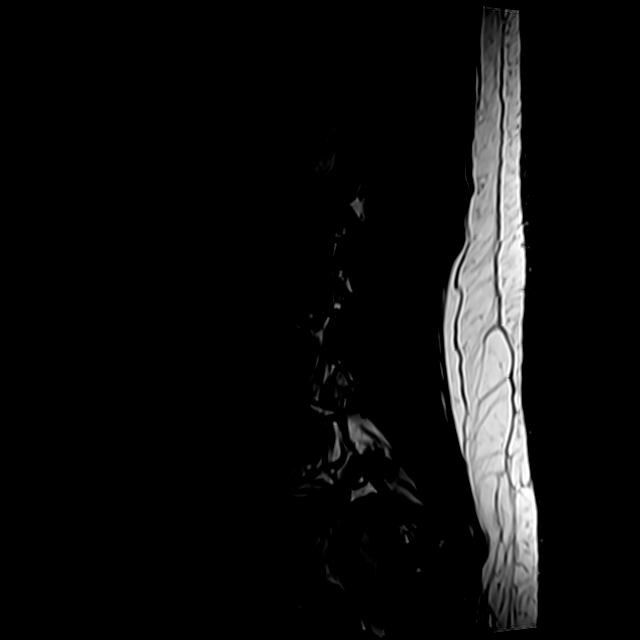
[im 7/13]
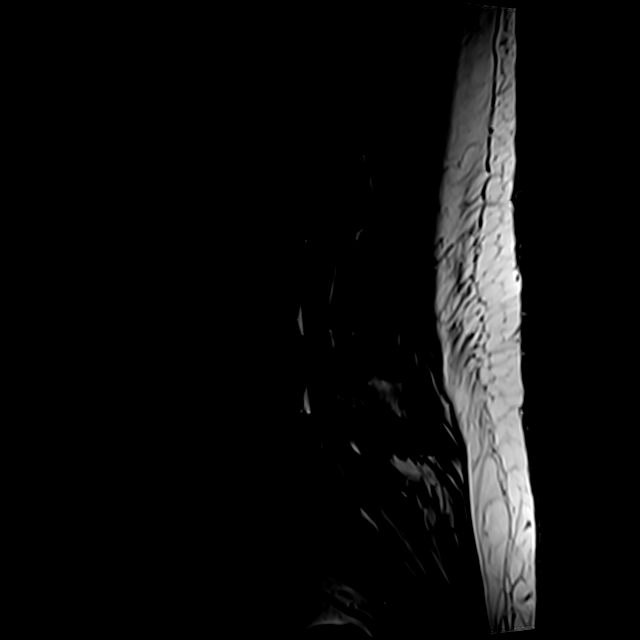
[im 13/13]
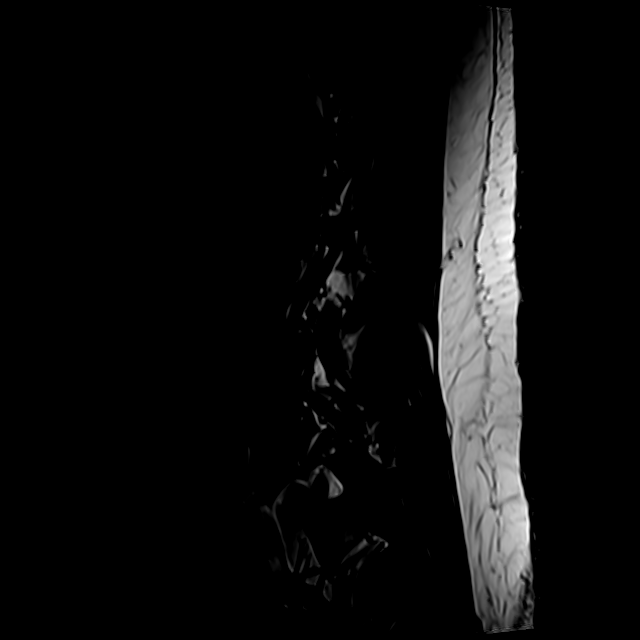

[Series 12: T2 · axial · 4.0mm · 0.35mm/px · z∈[-104,+93]mm · 5 of 39 slices shown (1 of 2)]
[im 1/39]
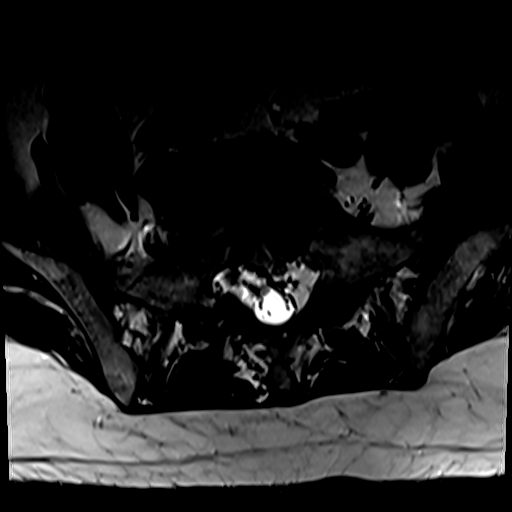
[im 4/39]
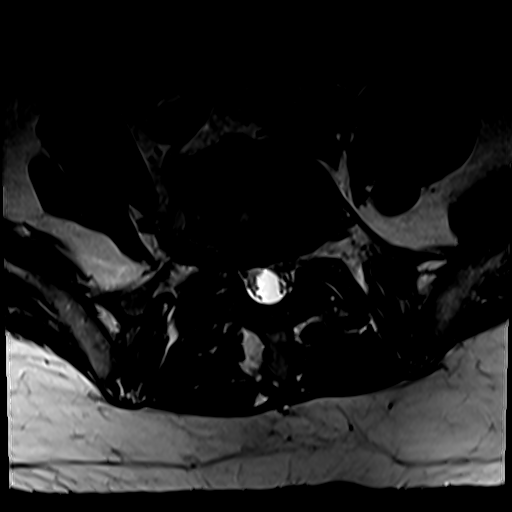
[im 8/39]
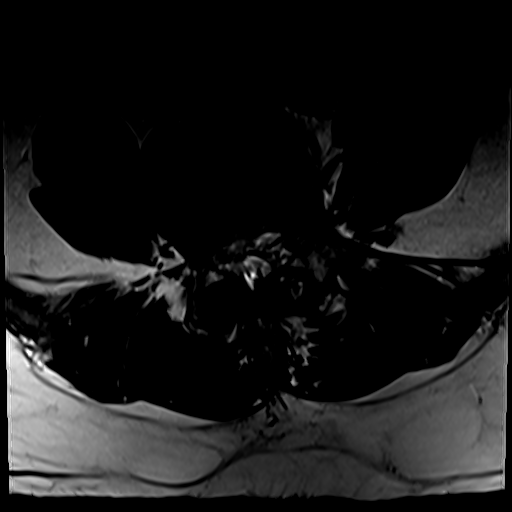
[im 20/39]
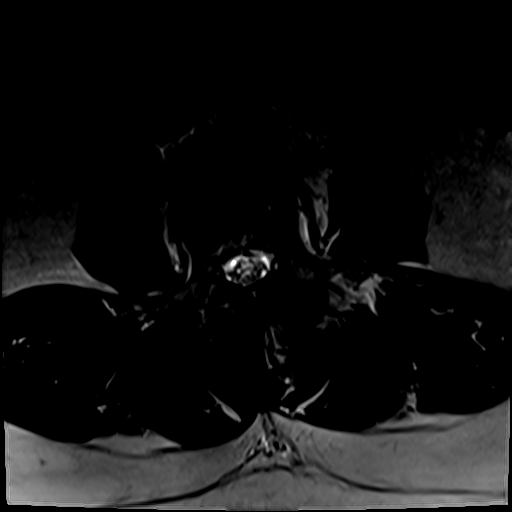
[im 35/39]
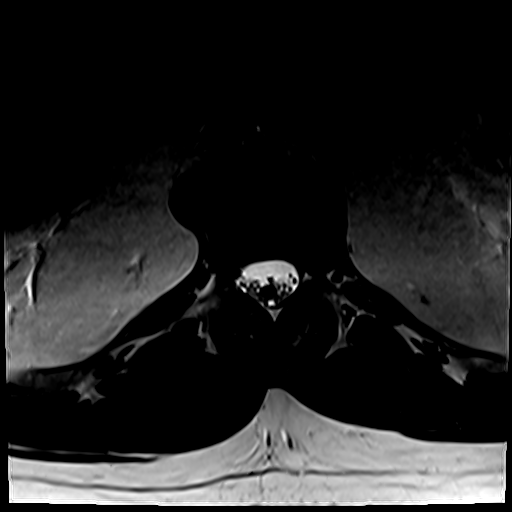

[Series 13: T2 · sagittal · 4.0mm · 0.44mm/px · 3 of 13 slices shown (2 of 2)]
[im 1/13]
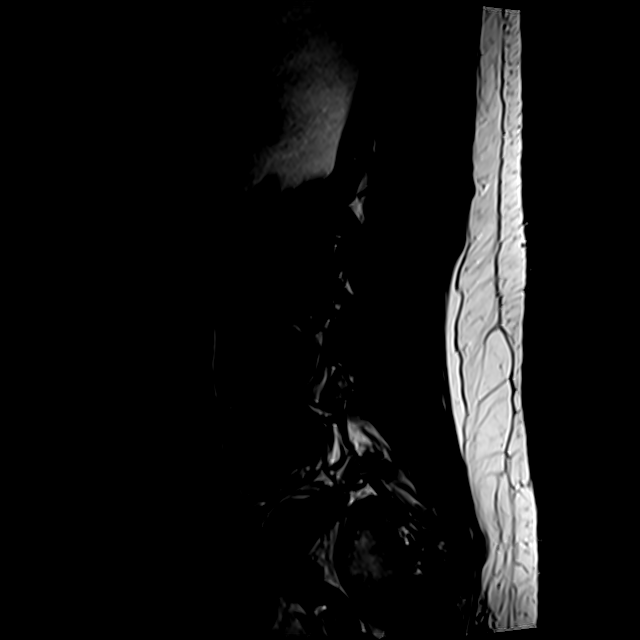
[im 9/13]
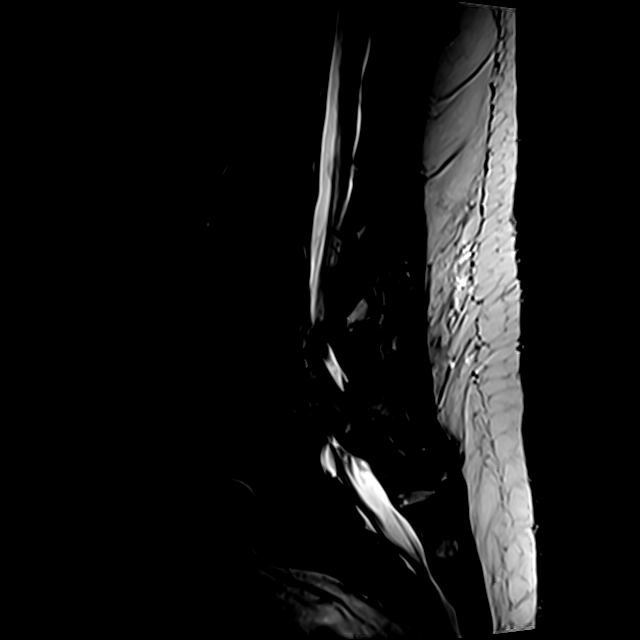
[im 13/13]
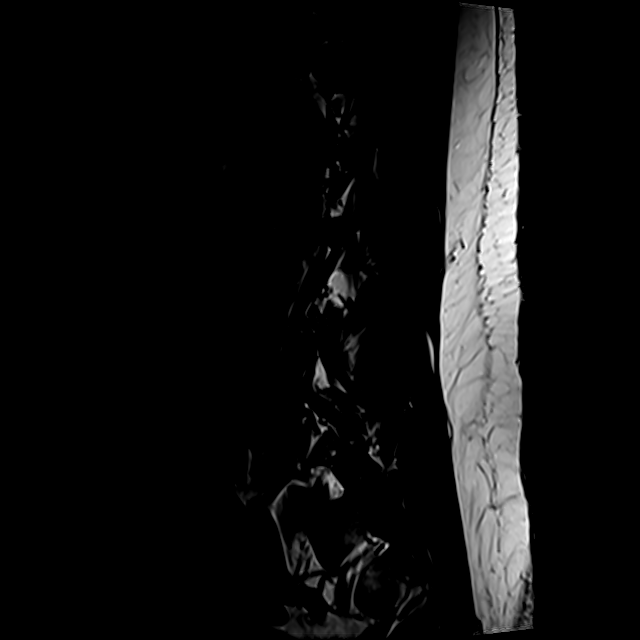

[Series 17: T1 · axial · 4.0mm · 0.35mm/px · z∈[-90,+93]mm · 3 of 39 slices shown (2 of 2)]
[im 4/39]
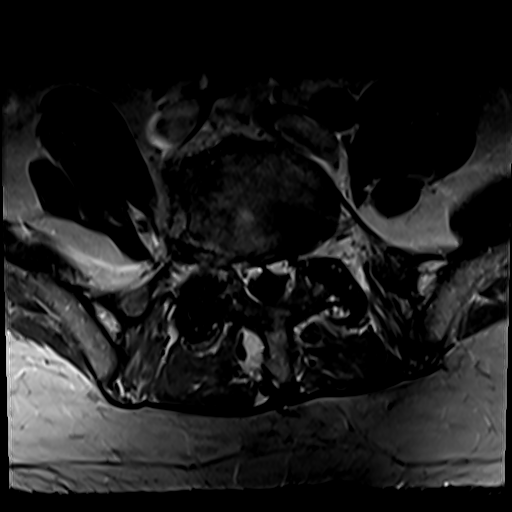
[im 20/39]
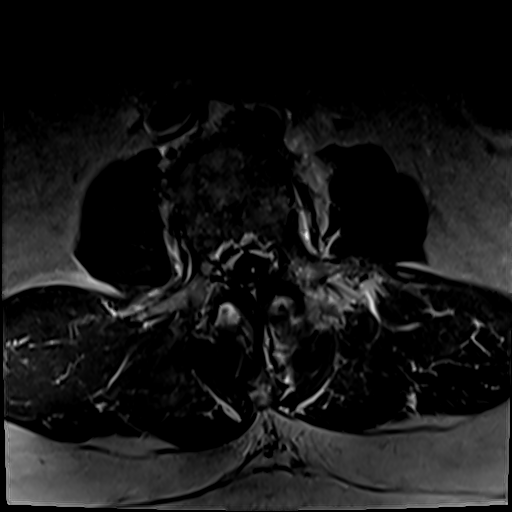
[im 35/39]
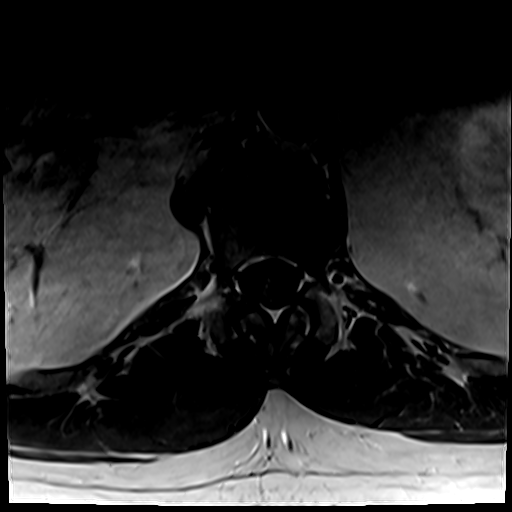

[14 of 48 positions shown; findings below may reference images not displayed]

FINDINGS: Segmentation:  Normal on the comparison radiographs.

Alignment: Stable lumbar lordosis including grade 1 anterolisthesis
of L4 on L5 measuring about 5 millimeters. Superimposed dextroconvex
lower lumbar scoliosis.

Vertebrae: Within the L4 vertebral body no discrete or suspicious
marrow lesion is identified. However, there is a bulky left lateral
endplate osteophyte (series 12, image 28) which probably simulated
the sclerotic lesion on the comparison lateral radiographic view.
There is mild degenerative appearing endplate marrow edema and
enhancement anteriorly at L3-L4, anteriorly superiorly at L2.
Chronic degenerative endplate marrow signal changes in the lower
lumbar posterior elements. Normal background bone marrow signal.
Intact visible sacrum and SI joints.

Conus medullaris and cauda equina: Conus extends to the L1 level. No
lower spinal cord or conus signal abnormality. There is mild linear
enhancement of the descending left L4 or L5 nerve roots in the cauda
equina (series 17, image 26) which is probably degenerative in
nature - see below. No suspicious intradural or dural enhancement.

Paraspinal and other soft tissues: Partially visible benign
appearing bilateral renal cysts, larger on the left. No
retroperitoneal or prevertebral lymphadenopathy. Negative visible
other abdominal viscera and paraspinal soft tissues.

Disc levels:

T11-T12: Probable interbody ankylosis from flowing right anterior
endplate osteophytes. Otherwise negative.

T12-L1:  Bulky anterior endplate osteophyte, otherwise negative.

L1-L2:  Mild circumferential disc bulge. No stenosis.

L2-L3: Mild circumferential disc bulge and posterior element
hypertrophy. No stenosis.

L3-L4: Mild to moderate circumferential disc bulge with endplate
spurring. Right foraminal annular fissure (series 13, image 2).
Moderate to severe facet and ligament flavum hypertrophy, with
capacious degenerative facet joint fluid greater on the left (series
12, image 24).

Moderate spinal and left lateral recess stenosis (left L4 nerve
level). Moderate left and mild right L3 foraminal stenosis.

L4-L5: Grade 1 anterolisthesis. Bulky left eccentric circumferential
disc bulge and endplate spurring. Left foraminal annular fissure.
Moderate to severe posterior element hypertrophy. Moderate to severe
spinal and severe left lateral recess stenosis (left L5 nerve
level). Moderate to severe left and mild right L4 foraminal
stenosis.

L5-S1: Right eccentric circumferential disc bulge and endplate
spurring with moderate facet hypertrophy. No spinal or lateral
recess stenosis. Mild to moderate left and moderate to severe right
L5 foraminal stenosis.
IMPRESSION: 1. No sclerotic or suspicious L4 vertebral body lesion. I suspect
that bulky left far lateral endplate osteophytosis at L4-L5
simulated the appearance of a sclerotic L4 lesion on the prior
lateral radiograph.
There is mild degenerative marrow edema and enhancement at the L2
superior endplate, L3-L4 endplates.

2. Grade 1 anterolisthesis at L4-L5 with superimposed dextroconvex
lumbar scoliosis. Advanced disc and severe posterior element
degeneration.
Subsequent moderate or severe spinal, lateral recess, and foraminal
stenosis at L3-L4 and L4-L5. And moderate to severe right neural
foraminal stenosis at L5-S1.
Associated degenerative enhancement of the descending left L5 nerve
roots in the cauda equina.

## 2020-11-08 LAB — BASIC METABOLIC PANEL
BUN/Creatinine Ratio: 8 — ABNORMAL LOW (ref 10–24)
BUN: 9 mg/dL (ref 8–27)
CO2: 23 mmol/L (ref 20–29)
Calcium: 9.3 mg/dL (ref 8.6–10.2)
Chloride: 103 mmol/L (ref 96–106)
Creatinine, Ser: 1.06 mg/dL (ref 0.76–1.27)
Glucose: 214 mg/dL — ABNORMAL HIGH (ref 65–99)
Potassium: 4 mmol/L (ref 3.5–5.2)
Sodium: 140 mmol/L (ref 134–144)
eGFR: 76 mL/min/{1.73_m2} (ref >59)

## 2020-11-28 NOTE — Progress Notes (Signed)
Follow up visit  Subjective:   Colin Weaver, male    DOB: Oct 21, 1950, 70 y.o.   MRN: [id number]   Chief Complaint  Patient presents with   Hypertension   Follow-up      HPI  70 y.o. African American male  with hypertension, hyperlipidemia, type 2 DM, BPH, NSTEMI 11/2018, mid LAD PCI, mildly reduced EF.  He is walking 40 minutes daily, that includes up and walking.  He also works out at [employer].  He denies any complaints of chest pain or shortness of breath or pressure remains elevated.  Average systolic blood pressure around 150 mmHg.     Current Outpatient Medications on File Prior to Visit  Medication Sig Dispense Refill   ANDROGEL PUMP 20.25 MG/ACT (1.62%) GEL Apply 1 application topically See admin instructions. Apply to each shoulder daily as directed     aspirin EC 81 MG tablet Take 81 mg by mouth daily.     HYDROcodone-acetaminophen (NORCO/VICODIN) 5-325 MG tablet Take 1 tablet by mouth every 4 (four) hours as needed. 8 tablet 0   losartan (COZAAR) 100 MG tablet Take 1 tablet (100 mg total) by mouth daily. 90 tablet 2   metFORMIN (GLUCOPHAGE) 500 MG tablet Take 1,000 mg by mouth 2 (two) times daily with a meal.      metoprolol succinate (TOPROL-XL) 100 MG 24 hr tablet Take 1 tablet (100 mg total) by mouth daily. 90 tablet 2   RAPAFLO 8 MG CAPS capsule Take 8 mg by mouth daily.     rosuvastatin (CRESTOR) 40 MG tablet Take 1 tablet (40 mg total) by mouth at bedtime. 90 tablet 3   No current facility-administered medications on file prior to visit.    Cardiovascular studies:   EKG 08/03/2020: Sinus rhythm 60 bpm First degree A-V block  Left bundle branch block QRS 168 msec  Echocardiogram 08/02/2019:  Moderately depressed LV systolic function with visual EF 35-40%. Left  ventricle cavity is normal in size. Severe concentric hypertrophy of the  left ventricle. Doppler evidence of grade I (impaired) diastolic  dysfunction, elevated LAP. Left ventricle regional  wall motion findings:  Basal anteroseptal, Basal inferoseptal, Mid anteroseptal, Mid  inferoseptal, Apical anterior, Apical septal and Apical cap hypokinesis.  Calculated EF 40%.  No evidence of aortic stenosis. Mild (Grade I) aortic regurgitation.  Trace tricuspid regurgitation.  Prior study dated 01/15/2019, noted LVEF of  35-40% with regional wall  motion abnormalities, mild AR, mild MR, mild PR.    Coronary angiography/intervention 11/23/2018: LM: Normal LAD: Mid 95% stenosis LCx: OM1 ostial 40%, mid LCx 40% stenosis. RCA: Normal with minimal luminal irregularities. Successful percutaneous coronary intervention mid LAD PTCA and stent placement Synergy 3.0 X 20 mm drug-eluting stent Post intervention TIMI flow II likely due to distal thromboembolization. Aggrastat bolus and infusion started.   Recent labs: 11/07/2020: Glucose 214, BUN/Cr 9/1.06. EGFR 76. Na/K 140/4.0.   08/23/2020: Glucose 147, BUN/Cr 10/1.1. EGFR 78. HbA1C 8.5% Chol 164, TG 89, HDL 35, LDL 112 TSH 1.4 normal  10/18/2019: Glucose 147, BUN/Cr 10/1.1. EGFR 63 Chol 89, TG 63, HDL 29, LDL 46 TSH 1.2 normal  04/2019: Glucose 147, BUN/Cr 10/1.17. EGFR >60. Na/K 142/4.4. Rest of the CMP normal H/H 15/48. MCV 89. Platelets 276 HbA1C 8.5% TSH 1.2 normal  11/24/2018: Glucose 122, BUN/Cr 9/1.06. EGFR >60. Na/K 139/4.0. Rest of the CMP normal H/H 14/43. MCV 86. Platelets 242 A1C 7.6% Chol 103, TG 55, HDL 34, LDL 58   Review of Systems  Cardiovascular:  Negative for chest pain, dyspnea on exertion, leg swelling, palpitations and syncope.        Vitals:   11/29/20 0850  BP: (!) 146/74  Pulse: 60  Temp: 97.6 F (36.4 C)  SpO2: 98%    Body mass index is 38.69 kg/m. Filed Weights   11/29/20 0850  Weight: 247 lb (112 kg)     Objective:   Physical Exam Vitals and nursing note reviewed.  Constitutional:      General: He is not in acute distress. Neck:     Vascular: No JVD.  Cardiovascular:      Rate and Rhythm: Normal rate and regular rhythm.     Heart sounds: Normal heart sounds. No murmur heard. Pulmonary:     Effort: Pulmonary effort is normal.     Breath sounds: Normal breath sounds. No wheezing or rales.  Musculoskeletal:     Right lower leg: No edema.     Left lower leg: No edema.        Assessment & Recommendations:   70 y.o. African American male  with hypertension, hyperlipidemia, type 2 DM, BPH, NSTEMI 11/2018, mid LAD PCI, mildly reduced EF.   Hypertension: Remains elevated, on losartan to 100 mg daily, metoprolol succinate to 100 mg daily. Recommend adding amlodipine 5 mg daily.    CAD without angina: NSTEMI 11/2018 Mid LAD 95% stenosis, treated with successful PTCA and stent placement Synergy 3.0 X 20 mm drug-eluting stent. 0% residual stenosis, but distal thromboembolization requiring aggrastat infusion (11/2018). Continue Aspirin, metoprolol succinate 25 mg, losartan 50 mg. See below regarding lipid management.   Continue regular exercise.    Mixed hyperlipidemia: LDL 112.  He had been noncompliant with Crestor 20 mg.  Reemphasized compliance.   If not checked today by PCP, I will check his lipid panel in 3 months.    HFrEF: Ischemic cardiomyopathy with mildly reduced LVEF 35-40%. Clinically, no heart failure symptoms. Unable to use Entresto due to h/o cough with lisinopril. Currently on  losartan and metoprolol succinate. Although EF remains low, he is clinically compensated.   Type 2 DM: Continue f/u w/endocrinology  F/u in 3 months  Thomos Domine Esther Hardy, MD [facility] [place] Pager: [phone] Office: [phone] If no answer Cell [phone]

## 2020-11-29 ENCOUNTER — Ambulatory Visit: Payer: 59 | Admitting: Cardiology

## 2020-11-29 ENCOUNTER — Encounter: Payer: Self-pay | Admitting: Cardiology

## 2020-11-29 ENCOUNTER — Other Ambulatory Visit: Payer: Self-pay

## 2020-11-29 VITALS — BP 146/74 | HR 60 | Temp 97.6°F | Ht 67.0 in | Wt 247.0 lb

## 2020-11-29 DIAGNOSIS — E782 Mixed hyperlipidemia: Secondary | ICD-10-CM

## 2020-11-29 DIAGNOSIS — I2510 Atherosclerotic heart disease of native coronary artery without angina pectoris: Secondary | ICD-10-CM

## 2020-11-29 DIAGNOSIS — I5022 Chronic systolic (congestive) heart failure: Secondary | ICD-10-CM

## 2020-11-29 DIAGNOSIS — I255 Ischemic cardiomyopathy: Secondary | ICD-10-CM

## 2020-11-29 DIAGNOSIS — I10 Essential (primary) hypertension: Secondary | ICD-10-CM

## 2020-11-29 MED ORDER — AMLODIPINE BESYLATE 5 MG PO TABS: 5.0000 mg | ORAL_TABLET | Freq: Every day | ORAL | 3 refills | Status: DC

## 2021-02-28 ENCOUNTER — Ambulatory Visit: Payer: 59 | Admitting: Cardiology

## 2021-03-01 ENCOUNTER — Ambulatory Visit: Payer: 59 | Admitting: Cardiology

## 2021-03-08 ENCOUNTER — Encounter: Payer: Self-pay | Admitting: Cardiology

## 2021-03-08 ENCOUNTER — Ambulatory Visit: Payer: 59 | Admitting: Cardiology

## 2021-03-08 ENCOUNTER — Other Ambulatory Visit: Payer: Self-pay

## 2021-03-08 VITALS — BP 130/70 | HR 63 | Temp 98.0°F | Resp 16 | Ht 67.0 in | Wt 226.0 lb

## 2021-03-08 DIAGNOSIS — I10 Essential (primary) hypertension: Secondary | ICD-10-CM

## 2021-03-08 DIAGNOSIS — E782 Mixed hyperlipidemia: Secondary | ICD-10-CM

## 2021-03-08 DIAGNOSIS — I2510 Atherosclerotic heart disease of native coronary artery without angina pectoris: Secondary | ICD-10-CM

## 2021-03-08 NOTE — Progress Notes (Signed)
Follow up visit  Subjective:   Colin Weaver, male    DOB: 1951/04/09, 70 y.o.   MRN: [id number]   Chief Complaint  Patient presents with   Coronary artery disease involving native coronary artery of   Cardiomyopathy   Hypertension   Follow-up    3 month      HPI  70 y.o. African American male  with hypertension, hyperlipidemia, type 2 DM, BPH, NSTEMI 11/2018, mid LAD PCI, mildly reduced EF.  Patient is doing well.  He walks regularly 3 times daily, without complaints of chest pain or shortness of breath.  Blood pressure is now very well controlled.  He has regular follow-up with his PCP and is working on his diabetes.   Current Outpatient Medications on File Prior to Visit  Medication Sig Dispense Refill   amLODipine (NORVASC) 5 MG tablet Take 1 tablet (5 mg total) by mouth daily. 90 tablet 3   ANDROGEL PUMP 20.25 MG/ACT (1.62%) GEL Apply 1 application topically See admin instructions. Apply to each shoulder daily as directed     aspirin EC 81 MG tablet Take 81 mg by mouth daily.     HYDROcodone-acetaminophen (NORCO/VICODIN) 5-325 MG tablet Take 1 tablet by mouth every 4 (four) hours as needed. 8 tablet 0   losartan (COZAAR) 100 MG tablet Take 1 tablet (100 mg total) by mouth daily. 90 tablet 2   metFORMIN (GLUCOPHAGE) 500 MG tablet Take 1,000 mg by mouth 2 (two) times daily with a meal.      metoprolol succinate (TOPROL-XL) 100 MG 24 hr tablet Take 1 tablet (100 mg total) by mouth daily. 90 tablet 2   RAPAFLO 8 MG CAPS capsule Take 8 mg by mouth daily.     rosuvastatin (CRESTOR) 40 MG tablet Take 1 tablet (40 mg total) by mouth at bedtime. 90 tablet 3   No current facility-administered medications on file prior to visit.    Cardiovascular studies:  EKG 03/08/2021: Sinus rhythm 62 bpm First degree A-V block  Left bundle branch block  Echocardiogram 08/02/2019:  Moderately depressed LV systolic function with visual EF 35-40%. Left  ventricle cavity is normal in size.  Severe concentric hypertrophy of the  left ventricle. Doppler evidence of grade I (impaired) diastolic  dysfunction, elevated LAP. Left ventricle regional wall motion findings:  Basal anteroseptal, Basal inferoseptal, Mid anteroseptal, Mid  inferoseptal, Apical anterior, Apical septal and Apical cap hypokinesis.  Calculated EF 40%.  No evidence of aortic stenosis. Mild (Grade I) aortic regurgitation.  Trace tricuspid regurgitation.  Prior study dated 01/15/2019, noted LVEF of  35-40% with regional wall  motion abnormalities, mild AR, mild MR, mild PR.    Coronary angiography/intervention 11/23/2018: LM: Normal LAD: Mid 95% stenosis LCx: OM1 ostial 40%, mid LCx 40% stenosis. RCA: Normal with minimal luminal irregularities. Successful percutaneous coronary intervention mid LAD PTCA and stent placement Synergy 3.0 X 20 mm drug-eluting stent Post intervention TIMI flow II likely due to distal thromboembolization. Aggrastat bolus and infusion started.   Recent labs: 11/28/2020: Chol 81, TG 44, HDL 27, LDL 45  11/07/2020: Glucose 214, BUN/Cr 9/1.06. EGFR 76. Na/K 140/4.0.   08/23/2020: Glucose 147, BUN/Cr 10/1.1. EGFR 78. HbA1C 8.5% Chol 164, TG 89, HDL 35, LDL 112 TSH 1.4 normal  10/18/2019: Glucose 147, BUN/Cr 10/1.1. EGFR 63 Chol 89, TG 63, HDL 29, LDL 46 TSH 1.2 normal  04/2019: Glucose 147, BUN/Cr 10/1.17. EGFR >60. Na/K 142/4.4. Rest of the CMP normal H/H 15/48. MCV 89. Platelets 276 HbA1C  8.5% TSH 1.2 normal  11/24/2018: Glucose 122, BUN/Cr 9/1.06. EGFR >60. Na/K 139/4.0. Rest of the CMP normal H/H 14/43. MCV 86. Platelets 242 A1C 7.6% Chol 103, TG 55, HDL 34, LDL 58   Review of Systems  Cardiovascular:  Negative for chest pain, dyspnea on exertion, leg swelling, palpitations and syncope.        Vitals:   03/08/21 1139  BP: 130/70  Pulse: 63  Resp: 16  Temp: 98 F (36.7 C)  SpO2: 96%    Body mass index is 35.4 kg/m. Filed Weights   03/08/21 1139   Weight: 226 lb (102.5 kg)     Objective:   Physical Exam Vitals and nursing note reviewed.  Constitutional:      General: He is not in acute distress. Neck:     Vascular: No JVD.  Cardiovascular:     Rate and Rhythm: Normal rate and regular rhythm.     Heart sounds: Normal heart sounds. No murmur heard. Pulmonary:     Effort: Pulmonary effort is normal.     Breath sounds: Normal breath sounds. No wheezing or rales.  Musculoskeletal:     Right lower leg: No edema.     Left lower leg: No edema.        Assessment & Recommendations:   70 y.o. African American male  with hypertension, hyperlipidemia, type 2 DM, BPH, NSTEMI 11/2018, mid LAD PCI, mildly reduced EF.   Hypertension: Now very well controlled  CAD without angina: NSTEMI 11/2018 Mid LAD 95% stenosis, treated with successful PTCA and stent placement Synergy 3.0 X 20 mm drug-eluting stent. 0% residual stenosis, but distal thromboembolization requiring aggrastat infusion (11/2018). Continue Aspirin, metoprolol succinate 25 mg, losartan 50 mg. See below regarding lipid management.   Continue regular exercise.   Mixed hyperlipidemia: Not very well controlled on Crestor 20 mg daily.  HFrEF: Ischemic cardiomyopathy with mildly reduced LVEF 35-40%. Clinically, no heart failure symptoms. Unable to use Entresto due to h/o cough with lisinopril. Currently on  losartan and metoprolol succinate. Although EF remains low, he is clinically compensated.   Type 2 DM: Continue f/u w/endocrinology  F/u in 1 year   Liron Eissler Esther Hardy, MD [facility] Pager: [phone] Office: [phone] If no answer Cell [phone]

## 2021-07-30 ENCOUNTER — Other Ambulatory Visit: Payer: Self-pay | Admitting: Cardiology

## 2021-07-30 DIAGNOSIS — I10 Essential (primary) hypertension: Secondary | ICD-10-CM

## 2021-09-26 ENCOUNTER — Other Ambulatory Visit: Payer: Self-pay | Admitting: Cardiology

## 2021-09-26 DIAGNOSIS — I214 Non-ST elevation (NSTEMI) myocardial infarction: Secondary | ICD-10-CM

## 2021-09-26 DIAGNOSIS — I2510 Atherosclerotic heart disease of native coronary artery without angina pectoris: Secondary | ICD-10-CM

## 2022-03-08 ENCOUNTER — Ambulatory Visit: Payer: 59 | Admitting: Cardiology

## 2022-03-12 ENCOUNTER — Other Ambulatory Visit: Payer: Self-pay | Admitting: Cardiology

## 2022-03-12 DIAGNOSIS — I10 Essential (primary) hypertension: Secondary | ICD-10-CM

## 2022-04-17 ENCOUNTER — Ambulatory Visit: Payer: 59 | Admitting: Cardiology

## 2022-05-08 ENCOUNTER — Ambulatory Visit: Payer: 59 | Admitting: Cardiology

## 2022-05-08 ENCOUNTER — Encounter: Payer: Self-pay | Admitting: Cardiology

## 2022-05-08 VITALS — BP 150/75 | HR 69 | Ht 67.0 in | Wt 237.0 lb

## 2022-05-08 DIAGNOSIS — I2510 Atherosclerotic heart disease of native coronary artery without angina pectoris: Secondary | ICD-10-CM

## 2022-05-08 DIAGNOSIS — E782 Mixed hyperlipidemia: Secondary | ICD-10-CM

## 2022-05-08 DIAGNOSIS — I10 Essential (primary) hypertension: Secondary | ICD-10-CM

## 2022-05-08 NOTE — Progress Notes (Signed)
Follow up visit  Subjective:   Colin Weaver, male    DOB: 1951/06/15, 71 y.o.   MRN: [id number]   Chief Complaint  Patient presents with   Follow-up    1 yr   Hyperlipidemia   Hypertension      HPI  71 y.o. African American male  with hypertension, hyperlipidemia, type 2 DM, BPH, NSTEMI 11/2018, mid LAD PCI, mildly reduced EF.  Patient is doing well.  He has lost 8 lbs with diet and walking. While his last A1C available to me in 11/2021 was >9%, hr tells me that the most recent A1C checked by his PCP was in 7s. BP elevated today, reports it is generally controlled. Patient denies chest pain, shortness of breath, palpitations, leg edema, orthopnea, PND, TIA/syncope.   Current Outpatient Medications:    amLODipine (NORVASC) 5 MG tablet, TAKE 1 TABLET (5 MG TOTAL) BY MOUTH DAILY., Disp: 30 tablet, Rfl: 11   ANDROGEL PUMP 20.25 MG/ACT (1.62%) GEL, Apply 1 application topically See admin instructions. Apply to each shoulder daily as directed, Disp: , Rfl:    aspirin EC 81 MG tablet, Take 81 mg by mouth daily., Disp: , Rfl:    HYDROcodone-acetaminophen (NORCO/VICODIN) 5-325 MG tablet, Take 1 tablet by mouth every 4 (four) hours as needed., Disp: 8 tablet, Rfl: 0   losartan (COZAAR) 100 MG tablet, TAKE 1 TABLET BY MOUTH EVERY DAY, Disp: 30 tablet, Rfl: 8   metFORMIN (GLUCOPHAGE) 500 MG tablet, Take 1,000 mg by mouth 2 (two) times daily with a meal. , Disp: , Rfl:    metoprolol succinate (TOPROL-XL) 100 MG 24 hr tablet, TAKE 1 TABLET BY MOUTH EVERY DAY, Disp: 30 tablet, Rfl: 8   RAPAFLO 8 MG CAPS capsule, Take 8 mg by mouth daily., Disp: , Rfl:    rosuvastatin (CRESTOR) 40 MG tablet, TAKE 1 TABLET BY MOUTH EVERYDAY AT BEDTIME, Disp: 30 tablet, Rfl: 11  Cardiovascular studies:  EKG 05/08/2022: Sinus rhythm 60 bpm First degree A-V block  Left bundle branch block   Echocardiogram 08/02/2019:  Moderately depressed LV systolic function with visual EF 35-40%. Left  ventricle cavity is  normal in size. Severe concentric hypertrophy of the  left ventricle. Doppler evidence of grade I (impaired) diastolic  dysfunction, elevated LAP. Left ventricle regional wall motion findings:  Basal anteroseptal, Basal inferoseptal, Mid anteroseptal, Mid  inferoseptal, Apical anterior, Apical septal and Apical cap hypokinesis.  Calculated EF 40%.  No evidence of aortic stenosis. Mild (Grade I) aortic regurgitation.  Trace tricuspid regurgitation.  Prior study dated 01/15/2019, noted LVEF of  35-40% with regional wall  motion abnormalities, mild AR, mild MR, mild PR.    Coronary angiography/intervention 11/23/2018: LM: Normal LAD: Mid 95% stenosis LCx: OM1 ostial 40%, mid LCx 40% stenosis. RCA: Normal with minimal luminal irregularities. Successful percutaneous coronary intervention mid LAD PTCA and stent placement Synergy 3.0 X 20 mm drug-eluting stent Post intervention TIMI flow II likely due to distal thromboembolization. Aggrastat bolus and infusion started.   Recent labs: 11/2021: HbA1C 9.8%  11/28/2020: Chol 81, TG 44, HDL 27, LDL 45  11/07/2020: Glucose 214, BUN/Cr 9/1.06. EGFR 76. Na/K 140/4.0.   08/23/2020: Glucose 147, BUN/Cr 10/1.1. EGFR 78. HbA1C 8.5% Chol 164, TG 89, HDL 35, LDL 112 TSH 1.4 normal  10/18/2019: Glucose 147, BUN/Cr 10/1.1. EGFR 63 Chol 89, TG 63, HDL 29, LDL 46 TSH 1.2 normal  04/2019: Glucose 147, BUN/Cr 10/1.17. EGFR >60. Na/K 142/4.4. Rest of the CMP normal H/H 15/48. MCV  89. Platelets 276 HbA1C 8.5% TSH 1.2 normal  11/24/2018: Glucose 122, BUN/Cr 9/1.06. EGFR >60. Na/K 139/4.0. Rest of the CMP normal H/H 14/43. MCV 86. Platelets 242 A1C 7.6% Chol 103, TG 55, HDL 34, LDL 58   Review of Systems  Cardiovascular:  Negative for chest pain, dyspnea on exertion, leg swelling, palpitations and syncope.         Vitals:   05/08/22 1230  BP: (!) 150/75  Pulse: 69  SpO2: 97%    Body mass index is 37.12 kg/m. Filed Weights   05/08/22  1230  Weight: 237 lb (107.5 kg)     Objective:   Physical Exam Vitals and nursing note reviewed.  Constitutional:      General: He is not in acute distress. Neck:     Vascular: No JVD.  Cardiovascular:     Rate and Rhythm: Normal rate and regular rhythm.     Heart sounds: Normal heart sounds. No murmur heard. Pulmonary:     Effort: Pulmonary effort is normal.     Breath sounds: Normal breath sounds. No wheezing or rales.  Musculoskeletal:     Right lower leg: No edema.     Left lower leg: No edema.        Assessment & Recommendations:   71 y.o. African American male  with hypertension, hyperlipidemia, type 2 DM, BPH, NSTEMI 11/2018, mid LAD PCI, mildly reduced EF.   Hypertension: BP elevated, reportedly well controlled at baseline. No change made today.  CAD without angina: NSTEMI 11/2018 Mid LAD 95% stenosis, treated with successful PTCA and stent placement Synergy 3.0 X 20 mm drug-eluting stent. 0% residual stenosis, but distal thromboembolization requiring aggrastat infusion (11/2018). Continue Aspirin, metoprolol succinate 25 mg, losartan 50 mg, Crestor 20 mg daily. Continue regular exercise.   Mixed hyperlipidemia: Continue Crestor 20 mg daily.  HFrEF: Ischemic cardiomyopathy with mildly reduced LVEF 35-40%. Clinically, no heart failure symptoms. Unable to use Entresto due to h/o cough with lisinopril. Currently on  losartan and metoprolol succinate. Although EF remains low, he is clinically compensated.   Type 2 DM: Continue f/u w/endocrinology  F/u in 6 months    Nigel Mormon, MD Pager: [phone] Office: [phone]

## 2022-05-10 ENCOUNTER — Encounter: Payer: Self-pay | Admitting: Cardiology

## 2022-05-29 ENCOUNTER — Other Ambulatory Visit: Payer: Self-pay | Admitting: Cardiology

## 2022-05-29 DIAGNOSIS — I10 Essential (primary) hypertension: Secondary | ICD-10-CM

## 2022-10-01 ENCOUNTER — Other Ambulatory Visit: Payer: Self-pay | Admitting: Cardiology

## 2022-10-01 DIAGNOSIS — I2510 Atherosclerotic heart disease of native coronary artery without angina pectoris: Secondary | ICD-10-CM

## 2022-10-01 DIAGNOSIS — I214 Non-ST elevation (NSTEMI) myocardial infarction: Secondary | ICD-10-CM

## 2022-10-31 ENCOUNTER — Ambulatory Visit: Payer: 59 | Admitting: Cardiology

## 2022-11-21 ENCOUNTER — Encounter: Payer: Self-pay | Admitting: Cardiology

## 2022-11-21 ENCOUNTER — Ambulatory Visit: Payer: Medicare Other | Admitting: Cardiology

## 2022-11-21 VITALS — BP 132/76 | HR 61 | Ht 67.0 in | Wt 245.0 lb

## 2022-11-21 DIAGNOSIS — I255 Ischemic cardiomyopathy: Secondary | ICD-10-CM

## 2022-11-21 DIAGNOSIS — I10 Essential (primary) hypertension: Secondary | ICD-10-CM

## 2022-11-21 DIAGNOSIS — I2510 Atherosclerotic heart disease of native coronary artery without angina pectoris: Secondary | ICD-10-CM

## 2022-11-21 DIAGNOSIS — E782 Mixed hyperlipidemia: Secondary | ICD-10-CM

## 2022-11-21 NOTE — Progress Notes (Signed)
Follow up visit  Subjective:   Colin Weaver, male    DOB: 08/15/1950, 72 y.o.   MRN: [id number]   Chief Complaint  Patient presents with   Hypertension   Follow-up      HPI  72 y.o. African American male  with hypertension, hyperlipidemia, type 2 DM, BPH, NSTEMI 11/2018, mid LAD PCI, mildly reduced EF.  Patient is doing well, denies chest pain, shortness of breath, palpitations, leg edema, orthopnea, PND, TIA/syncope.  Blood pressure is well-controlled.  He is working with his PCP regarding diabetes control.  He is not walking as much as he was before.    Current Outpatient Medications:    amLODipine (NORVASC) 5 MG tablet, TAKE 1 TABLET (5 MG TOTAL) BY MOUTH DAILY., Disp: 30 tablet, Rfl: 11   ANDROGEL PUMP 20.25 MG/ACT (1.62%) GEL, Apply 1 application topically See admin instructions. Apply to each shoulder daily as directed, Disp: , Rfl:    aspirin EC 81 MG tablet, Take 81 mg by mouth daily., Disp: , Rfl:    HYDROcodone-acetaminophen (NORCO/VICODIN) 5-325 MG tablet, Take 1 tablet by mouth every 4 (four) hours as needed., Disp: 8 tablet, Rfl: 0   losartan (COZAAR) 100 MG tablet, TAKE 1 TABLET BY MOUTH EVERY DAY, Disp: 30 tablet, Rfl: 8   metFORMIN (GLUCOPHAGE) 500 MG tablet, Take 1,000 mg by mouth 2 (two) times daily with a meal. , Disp: , Rfl:    metoprolol succinate (TOPROL-XL) 100 MG 24 hr tablet, TAKE 1 TABLET BY MOUTH EVERY DAY, Disp: 30 tablet, Rfl: 8   RAPAFLO 8 MG CAPS capsule, Take 8 mg by mouth daily., Disp: , Rfl:    rosuvastatin (CRESTOR) 40 MG tablet, TAKE 1 TABLET BY MOUTH EVERYDAY AT BEDTIME, Disp: 30 tablet, Rfl: 11  Cardiovascular studies:  EKG 05/08/2022: Sinus rhythm 60 bpm First degree A-V block  Left bundle branch block   Echocardiogram 08/02/2019:  Moderately depressed LV systolic function with visual EF 35-40%. Left  ventricle cavity is normal in size. Severe concentric hypertrophy of the  left ventricle. Doppler evidence of grade I (impaired)  diastolic  dysfunction, elevated LAP. Left ventricle regional wall motion findings:  Basal anteroseptal, Basal inferoseptal, Mid anteroseptal, Mid  inferoseptal, Apical anterior, Apical septal and Apical cap hypokinesis.  Calculated EF 40%.  No evidence of aortic stenosis. Mild (Grade I) aortic regurgitation.  Trace tricuspid regurgitation.  Prior study dated 01/15/2019, noted LVEF of  35-40% with regional wall  motion abnormalities, mild AR, mild MR, mild PR.    Coronary angiography/intervention 11/23/2018: LM: Normal LAD: Mid 95% stenosis LCx: OM1 ostial 40%, mid LCx 40% stenosis. RCA: Normal with minimal luminal irregularities. Successful percutaneous coronary intervention mid LAD PTCA and stent placement Synergy 3.0 X 20 mm drug-eluting stent Post intervention TIMI flow II likely due to distal thromboembolization. Aggrastat bolus and infusion started.   Recent labs: Nov 2023-April 2024: EGFR 66. Hb 14.6 HbA1C 8.2% Chol 114, TG 90, HDL 30, LDL 66  11/2021: HbA1C 9.8%  11/28/2020: Chol 81, TG 44, HDL 27, LDL 45   Review of Systems  Cardiovascular:  Negative for chest pain, dyspnea on exertion, leg swelling, palpitations and syncope.         Vitals:   11/21/22 1104 11/21/22 1107  BP: (!) 150/77 132/76  Pulse: 60 61  SpO2: 97%     Body mass index is 38.37 kg/m. Filed Weights   11/21/22 1104  Weight: 245 lb (111.1 kg)     Objective:   Physical  Exam Vitals and nursing note reviewed.  Constitutional:      General: He is not in acute distress. Neck:     Vascular: No JVD.  Cardiovascular:     Rate and Rhythm: Normal rate and regular rhythm.     Heart sounds: Normal heart sounds. No murmur heard. Pulmonary:     Effort: Pulmonary effort is normal.     Breath sounds: Normal breath sounds. No wheezing or rales.  Musculoskeletal:     Right lower leg: No edema.     Left lower leg: No edema.         Assessment & Recommendations:   72 y.o. African  American male  with hypertension, hyperlipidemia, type 2 DM, BPH, NSTEMI 11/2018, mid LAD PCI, mildly reduced EF.  Hypertension: Controlled.  No changes made today.  CAD without angina: NSTEMI 11/2018 Mid LAD 95% stenosis, treated with successful PTCA and stent placement Synergy 3.0 X 20 mm drug-eluting stent. 0% residual stenosis, but distal thromboembolization requiring aggrastat infusion (11/2018). Continue Aspirin, metoprolol succinate 25 mg, losartan 50 mg, Crestor 20 mg daily. Continue regular exercise.   Mixed hyperlipidemia: Chol 114, TG 90, HDL 30, LDL 66 (04/2022). Continue Crestor 20 mg daily.  HFrEF: Ischemic cardiomyopathy with mildly reduced LVEF 35-40%. Clinically, no heart failure symptoms. Unable to use Entresto due to h/o cough with lisinopril. Currently on  losartan and metoprolol succinate. Although EF remains low, he is clinically compensated.  Will check echocardiogram since it has been 3 years since her last echocardiogram.  Type 2 DM: Continue f/u w/endocrinology  F/u in 1 year   Elder Negus, MD Pager: [phone] Office: [phone]

## 2022-11-26 ENCOUNTER — Other Ambulatory Visit: Payer: Self-pay | Admitting: Family Medicine

## 2022-11-26 DIAGNOSIS — Z006 Encounter for examination for normal comparison and control in clinical research program: Secondary | ICD-10-CM

## 2022-12-09 ENCOUNTER — Ambulatory Visit
Admission: RE | Admit: 2022-12-09 | Discharge: 2022-12-09 | Disposition: A | Discharge status: Home / Self Care | Payer: No Typology Code available for payment source | Source: Ambulatory Visit | Attending: Family Medicine | Admitting: Family Medicine

## 2022-12-09 DIAGNOSIS — Z006 Encounter for examination for normal comparison and control in clinical research program: Secondary | ICD-10-CM

## 2022-12-13 ENCOUNTER — Ambulatory Visit: Payer: PRIVATE HEALTH INSURANCE

## 2022-12-13 DIAGNOSIS — I2510 Atherosclerotic heart disease of native coronary artery without angina pectoris: Secondary | ICD-10-CM

## 2022-12-13 DIAGNOSIS — I255 Ischemic cardiomyopathy: Secondary | ICD-10-CM

## 2022-12-16 NOTE — Progress Notes (Signed)
Heart function has improved.  Keep up the good work, and the medications.  Thanks MJP

## 2022-12-16 NOTE — Progress Notes (Signed)
Called and spoke with patient regarding his echocardiogram results.  ?

## 2023-05-28 ENCOUNTER — Ambulatory Visit: Payer: Self-pay | Admitting: Cardiology

## 2023-06-10 ENCOUNTER — Other Ambulatory Visit: Payer: Self-pay | Admitting: Cardiology

## 2023-06-10 DIAGNOSIS — I10 Essential (primary) hypertension: Secondary | ICD-10-CM

## 2023-10-07 ENCOUNTER — Other Ambulatory Visit: Payer: Self-pay | Admitting: Cardiology

## 2023-10-07 DIAGNOSIS — I2510 Atherosclerotic heart disease of native coronary artery without angina pectoris: Secondary | ICD-10-CM

## 2023-10-07 DIAGNOSIS — I214 Non-ST elevation (NSTEMI) myocardial infarction: Secondary | ICD-10-CM

## 2023-12-14 ENCOUNTER — Other Ambulatory Visit: Payer: Self-pay | Admitting: Cardiology

## 2023-12-14 DIAGNOSIS — I10 Essential (primary) hypertension: Secondary | ICD-10-CM

## 2024-01-14 ENCOUNTER — Other Ambulatory Visit: Payer: Self-pay | Admitting: Cardiology

## 2024-01-14 DIAGNOSIS — I10 Essential (primary) hypertension: Secondary | ICD-10-CM

## 2024-02-18 ENCOUNTER — Other Ambulatory Visit: Payer: Self-pay | Admitting: Cardiology

## 2024-02-18 DIAGNOSIS — I10 Essential (primary) hypertension: Secondary | ICD-10-CM

## 2024-03-17 ENCOUNTER — Telehealth: Payer: Self-pay | Admitting: Cardiology

## 2024-03-17 DIAGNOSIS — I10 Essential (primary) hypertension: Secondary | ICD-10-CM

## 2024-03-17 MED ORDER — METOPROLOL SUCCINATE ER 100 MG PO TB24: 100.0000 mg | ORAL_TABLET | Freq: Every day | ORAL | 0 refills | Status: DC

## 2024-03-17 NOTE — Telephone Encounter (Signed)
*  STAT* If patient is at the pharmacy, call can be transferred to refill team.   1. Which medications need to be refilled? (please list name of each medication and dose if known) metoprolol  succinate (TOPROL -XL) 100 MG 24 hr tablet   2. Which pharmacy/location (including street and city if local pharmacy) is medication to be sent to?  [id number] - [place],  - [address] AT [address] OF [facility] [address]    3. Do they need a 30 day or 90 day supply? 90  Patient has appt tomorrow 10/2

## 2024-03-17 NOTE — Telephone Encounter (Signed)
 RX sent in

## 2024-03-18 ENCOUNTER — Encounter: Payer: Self-pay | Admitting: Cardiology

## 2024-03-18 ENCOUNTER — Ambulatory Visit: Attending: Cardiology | Admitting: Cardiology

## 2024-03-18 VITALS — BP 137/79 | HR 73 | Ht 67.0 in | Wt 239.0 lb

## 2024-03-18 DIAGNOSIS — I2510 Atherosclerotic heart disease of native coronary artery without angina pectoris: Secondary | ICD-10-CM | POA: Insufficient documentation

## 2024-03-18 DIAGNOSIS — I10 Essential (primary) hypertension: Secondary | ICD-10-CM | POA: Insufficient documentation

## 2024-03-18 DIAGNOSIS — E782 Mixed hyperlipidemia: Secondary | ICD-10-CM | POA: Diagnosis present

## 2024-03-18 MED ORDER — METOPROLOL SUCCINATE ER 100 MG PO TB24: 100.0000 mg | ORAL_TABLET | Freq: Every day | ORAL | 3 refills | Status: AC

## 2024-03-18 NOTE — Progress Notes (Signed)
 Cardiology Office Note:  .   Date:  03/18/2024  ID:  Colin Weaver, DOB 06/29/1950, MRN [id number] PCP: Gerome Brunet, DO  [facility] Providers Cardiologist:  Newman Lawrence, MD PCP: Gerome Brunet, DO  Chief Complaint  Patient presents with   Coronary Artery Disease     Colin Weaver is a 73 y.o. male with hypertension, hyperlipidemia, type 2 DM, BPH, NSTEMI 11/2018, mid LAD PCI    Discussed the use of AI scribe software for clinical note transcription with the patient, who gave verbal consent to proceed.  History of Present Illness  Patient is doing well.  He walks regularly without recurrence of chest pain or shortness of breath.  He also denies leg edema.  He has regular follow-up with PCP.  Reviewed recent lab results with the patient, did display.     Vitals:   03/18/24 1025  BP: 137/79  Pulse: 73  SpO2: 93%      Review of Systems  Cardiovascular:  Negative for chest pain, dyspnea on exertion, leg swelling, palpitations and syncope.        Studies Reviewed: SABRA        EKG 03/18/2024: Sinus rhythm with 1st degree A-V block Left axis deviation Left bundle branch block When compared with ECG of 24-Nov-2018 05:45, QRS axis Shifted left T wave inversion no longer evident in Inferior leads      Echocardiogram 2024: Left ventricle cavity is normal in size. Severe concentric hypertrophy of the left ventricle. Normal global wall motion. Abnormal septal wall motion due to left bundle branch block. Normal diastolic filling pattern. Normal LV systolic function with visual EF 60-65%. Trileaflet aortic valve. Mild (Grade I) aortic regurgitation. Compared to 08/02/2019, LVEF has improved from 35 to 40% to the present 60 to 65%.  Previously noted wall motion abnormality in the anterior and anteroapical hypokinesis no longer present.  Coronary angiography/intervention 2020: LM: Normal LAD: Mid 95% stenosis LCx: OM1 ostial 40%, mid LCx 40% stenosis. RCA:  Normal with minimal luminal irregularities. Successful percutaneous coronary intervention mid LAD PTCA and stent placement Synergy 3.0 X 20 mm drug-eluting stent Post intervention TIMI flow II likely due to distal thromboembolization. Aggrastat  bolus and infusion started.  Labs 01/2024: Chol 99, TG 63, HDL 33, LDL 52 HbA1C 7.7% Hb 13.9 Cr 1.26 TSH 2.2   Physical Exam Vitals and nursing note reviewed.  Constitutional:      General: He is not in acute distress. Neck:     Vascular: No JVD.  Cardiovascular:     Rate and Rhythm: Normal rate and regular rhythm.     Heart sounds: Normal heart sounds. No murmur heard. Pulmonary:     Effort: Pulmonary effort is normal.     Breath sounds: Normal breath sounds. No wheezing or rales.  Musculoskeletal:     Right lower leg: No edema.     Left lower leg: No edema.      VISIT DIAGNOSES:   ICD-10-CM   1. Essential hypertension  I10 EKG 12-Lead    metoprolol  succinate (TOPROL -XL) 100 MG 24 hr tablet    2. Coronary artery disease involving native coronary artery of native heart without angina pectoris  I25.10 EKG 12-Lead    3. Mixed hyperlipidemia  E78.2 EKG 12-Lead       Colin Weaver is a 73 y.o. male with hypertension, hyperlipidemia, type 2 DM, BPH, NSTEMI 11/2018, mid LAD PCI  Assessment & Plan  Hypertension: SBP 137 mmHg.  No changes made today.  Recommend regular walking and low-salt diet and home monitoring.  If blood pressure consistently stays >130/80 mmHg, could uptitrate antihypertensive therapy.   CAD without angina: NSTEMI 11/2018 Mid LAD 95% stenosis, treated with successful PTCA and stent placement Synergy 3.0 X 20 mm drug-eluting stent. 0% residual stenosis, but distal thromboembolization requiring aggrastat  infusion (11/2018). Continue Aspirin , metoprolol  succinate 25 mg, losartan  50 mg, Crestor  20 mg daily. Continue regular exercise.  Refilled metoprolol  today.   Mixed hyperlipidemia: Continue Crestor  20 mg  daily. Lipids well-controlled.   Type 2 DM: Continue f/u w/endocrinology Goal A1c <7%.    Meds ordered this encounter  Medications   metoprolol  succinate (TOPROL -XL) 100 MG 24 hr tablet    Sig: Take 1 tablet (100 mg total) by mouth daily.    Dispense:  90 tablet    Refill:  3     F/u in 1 year  Signed, Newman JINNY Lawrence, MD

## 2024-03-18 NOTE — Patient Instructions (Signed)
 Medication Instructions:   *If you need a refill on your cardiac medications before your next appointment, please call your pharmacy*  Follow-Up: At [facility], you and your health needs are our priority.  As part of our continuing mission to provide you with exceptional heart care, our providers are all part of one team.  This team includes your primary Cardiologist (physician) and Advanced Practice Providers or APPs (Physician Assistants and Nurse Practitioners) who all work together to provide you with the care you need, when you need it.  Your next appointment:   1 year(s)  Provider:   Newman JINNY Lawrence, MD    We recommend signing up for the patient portal called MyChart.  Sign up information is provided on this After Visit Summary.  MyChart is used to connect with patients for Virtual Visits (Telemedicine).  Patients are able to view lab/test results, encounter notes, upcoming appointments, etc.  Non-urgent messages can be sent to your provider as well.   To learn more about what you can do with MyChart, go to [email or web address].

## 2024-03-26 ENCOUNTER — Other Ambulatory Visit: Payer: Self-pay | Admitting: Family Medicine

## 2024-03-26 DIAGNOSIS — Z006 Encounter for examination for normal comparison and control in clinical research program: Secondary | ICD-10-CM

## 2024-06-07 ENCOUNTER — Inpatient Hospital Stay: Admission: RE | Admit: 2024-06-07 | Source: Ambulatory Visit

## 2024-06-07 ENCOUNTER — Inpatient Hospital Stay
Admission: RE | Admit: 2024-06-07 | Discharge: 2024-06-07 | Disposition: A | Source: Ambulatory Visit | Attending: Family Medicine | Admitting: Family Medicine

## 2024-06-07 DIAGNOSIS — Z006 Encounter for examination for normal comparison and control in clinical research program: Secondary | ICD-10-CM

## 2024-10-15 ENCOUNTER — Other Ambulatory Visit
